# Patient Record
Sex: Female | Born: 1960 | Race: Black or African American | Hispanic: No | Marital: Married | Smoking: Former smoker
Health system: Southern US, Community
[De-identification: ages and names within clinical notes are randomized; demographics above are authoritative.]

## PROBLEM LIST (undated history)

## (undated) DIAGNOSIS — E119 Type 2 diabetes mellitus without complications: Secondary | ICD-10-CM

## (undated) DIAGNOSIS — E785 Hyperlipidemia, unspecified: Secondary | ICD-10-CM

## (undated) DIAGNOSIS — I10 Essential (primary) hypertension: Secondary | ICD-10-CM

## (undated) DIAGNOSIS — J40 Bronchitis, not specified as acute or chronic: Secondary | ICD-10-CM

## (undated) DIAGNOSIS — I639 Cerebral infarction, unspecified: Secondary | ICD-10-CM

## (undated) HISTORY — PX: ABDOMINAL SURGERY: SHX537

## (undated) HISTORY — DX: Type 2 diabetes mellitus without complications: E11.9

## (undated) HISTORY — DX: Essential (primary) hypertension: I10

## (undated) HISTORY — DX: Hyperlipidemia, unspecified: E78.5

---

## 2006-07-02 ENCOUNTER — Ambulatory Visit: Payer: Self-pay | Admitting: Cardiovascular Disease

## 2006-07-02 ENCOUNTER — Inpatient Hospital Stay (HOSPITAL_COMMUNITY): Admission: EM | Admit: 2006-07-02 | Discharge: 2006-07-09 | Payer: Self-pay | Admitting: Emergency Medicine

## 2006-07-03 ENCOUNTER — Encounter: Payer: Self-pay | Admitting: Cardiovascular Disease

## 2006-07-08 ENCOUNTER — Ambulatory Visit: Payer: Self-pay | Admitting: Physical Medicine & Rehabilitation

## 2006-07-09 ENCOUNTER — Inpatient Hospital Stay (HOSPITAL_COMMUNITY)
Admission: RE | Admit: 2006-07-09 | Discharge: 2006-08-02 | Payer: Self-pay | Admitting: Physical Medicine & Rehabilitation

## 2006-07-09 ENCOUNTER — Ambulatory Visit: Payer: Self-pay | Admitting: Physical Medicine & Rehabilitation

## 2006-08-27 ENCOUNTER — Encounter
Admission: RE | Admit: 2006-08-27 | Discharge: 2006-08-29 | Payer: Self-pay | Admitting: Physical Medicine & Rehabilitation

## 2006-08-29 ENCOUNTER — Ambulatory Visit: Payer: Self-pay | Admitting: Physical Medicine & Rehabilitation

## 2006-09-05 ENCOUNTER — Encounter
Admission: RE | Admit: 2006-09-05 | Discharge: 2006-11-01 | Payer: Self-pay | Admitting: Physical Medicine & Rehabilitation

## 2006-11-19 ENCOUNTER — Encounter (HOSPITAL_COMMUNITY): Admission: RE | Admit: 2006-11-19 | Discharge: 2006-11-19 | Payer: Self-pay | Admitting: Internal Medicine

## 2006-11-25 ENCOUNTER — Encounter (HOSPITAL_COMMUNITY): Admission: RE | Admit: 2006-11-25 | Discharge: 2006-12-25 | Payer: Self-pay | Admitting: Internal Medicine

## 2006-12-27 ENCOUNTER — Encounter (HOSPITAL_COMMUNITY): Admission: RE | Admit: 2006-12-27 | Discharge: 2007-01-26 | Payer: Self-pay | Admitting: Internal Medicine

## 2007-01-27 ENCOUNTER — Encounter (HOSPITAL_COMMUNITY): Admission: RE | Admit: 2007-01-27 | Discharge: 2007-02-19 | Payer: Self-pay | Admitting: Internal Medicine

## 2007-02-21 ENCOUNTER — Encounter (HOSPITAL_COMMUNITY): Admission: RE | Admit: 2007-02-21 | Discharge: 2007-03-23 | Payer: Self-pay | Admitting: Internal Medicine

## 2007-07-09 ENCOUNTER — Encounter: Admission: RE | Admit: 2007-07-09 | Discharge: 2007-08-07 | Payer: Self-pay | Admitting: Internal Medicine

## 2008-06-15 ENCOUNTER — Encounter: Admission: RE | Admit: 2008-06-15 | Discharge: 2008-06-15 | Payer: Self-pay | Admitting: Obstetrics and Gynecology

## 2009-04-02 ENCOUNTER — Encounter: Admission: RE | Admit: 2009-04-02 | Discharge: 2009-04-02 | Payer: Self-pay | Admitting: Obstetrics and Gynecology

## 2009-05-09 ENCOUNTER — Ambulatory Visit (HOSPITAL_COMMUNITY): Admission: RE | Admit: 2009-05-09 | Discharge: 2009-05-09 | Payer: Self-pay | Admitting: Interventional Radiology

## 2009-05-10 ENCOUNTER — Ambulatory Visit (HOSPITAL_COMMUNITY): Admission: RE | Admit: 2009-05-10 | Discharge: 2009-05-11 | Payer: Self-pay | Admitting: Interventional Radiology

## 2009-05-25 ENCOUNTER — Encounter: Admission: RE | Admit: 2009-05-25 | Discharge: 2009-05-25 | Payer: Self-pay | Admitting: Interventional Radiology

## 2009-11-23 ENCOUNTER — Inpatient Hospital Stay (HOSPITAL_COMMUNITY): Admission: EM | Admit: 2009-11-23 | Discharge: 2009-12-02 | Payer: Self-pay | Admitting: Emergency Medicine

## 2009-11-28 ENCOUNTER — Ambulatory Visit: Payer: Self-pay | Admitting: Physical Medicine & Rehabilitation

## 2010-01-10 ENCOUNTER — Encounter: Admission: RE | Admit: 2010-01-10 | Discharge: 2010-01-10 | Payer: Self-pay | Admitting: Interventional Radiology

## 2010-01-10 ENCOUNTER — Encounter: Admission: RE | Admit: 2010-01-10 | Discharge: 2010-01-10 | Payer: Self-pay | Admitting: Obstetrics and Gynecology

## 2010-01-17 ENCOUNTER — Encounter: Admission: RE | Admit: 2010-01-17 | Discharge: 2010-01-17 | Payer: Self-pay | Admitting: Neurosurgery

## 2010-03-11 ENCOUNTER — Encounter: Payer: Self-pay | Admitting: Interventional Radiology

## 2010-03-11 ENCOUNTER — Encounter: Payer: Self-pay | Admitting: Obstetrics and Gynecology

## 2010-03-12 ENCOUNTER — Encounter: Payer: Self-pay | Admitting: Physical Medicine & Rehabilitation

## 2010-03-13 ENCOUNTER — Encounter: Payer: Self-pay | Admitting: Obstetrics and Gynecology

## 2010-03-16 ENCOUNTER — Other Ambulatory Visit: Payer: Self-pay | Admitting: Internal Medicine

## 2010-03-16 DIAGNOSIS — Z1239 Encounter for other screening for malignant neoplasm of breast: Secondary | ICD-10-CM

## 2010-05-04 LAB — PREPARE FRESH FROZEN PLASMA

## 2010-05-04 LAB — CBC
HCT: 37.9 % (ref 36.0–46.0)
HCT: 39.6 % (ref 36.0–46.0)
MCH: 28 pg (ref 26.0–34.0)
MCH: 28.1 pg (ref 26.0–34.0)
MCV: 88.3 fL (ref 78.0–100.0)
MCV: 88.4 fL (ref 78.0–100.0)
Platelets: 392 10*3/uL (ref 150–400)
RDW: 15.5 % (ref 11.5–15.5)
RDW: 15.9 % — ABNORMAL HIGH (ref 11.5–15.5)
WBC: 11.4 10*3/uL — ABNORMAL HIGH (ref 4.0–10.5)
WBC: 15.9 10*3/uL — ABNORMAL HIGH (ref 4.0–10.5)

## 2010-05-04 LAB — DIFFERENTIAL
Basophils Absolute: 0 10*3/uL (ref 0.0–0.1)
Eosinophils Absolute: 0.1 10*3/uL (ref 0.0–0.7)
Eosinophils Relative: 1 % (ref 0–5)
Lymphocytes Relative: 19 % (ref 12–46)
Lymphs Abs: 2.1 10*3/uL (ref 0.7–4.0)
Monocytes Absolute: 0.5 10*3/uL (ref 0.1–1.0)

## 2010-05-04 LAB — BASIC METABOLIC PANEL
BUN: 12 mg/dL (ref 6–23)
CO2: 21 mEq/L (ref 19–32)
Chloride: 109 mEq/L (ref 96–112)
Glucose, Bld: 129 mg/dL — ABNORMAL HIGH (ref 70–99)
Potassium: 4 mEq/L (ref 3.5–5.1)

## 2010-05-04 LAB — POCT I-STAT, CHEM 8
Calcium, Ion: 1.16 mmol/L (ref 1.12–1.32)
Chloride: 106 mEq/L (ref 96–112)
Creatinine, Ser: 0.6 mg/dL (ref 0.4–1.2)
Glucose, Bld: 105 mg/dL — ABNORMAL HIGH (ref 70–99)
HCT: 41 % (ref 36.0–46.0)
Hemoglobin: 13.9 g/dL (ref 12.0–15.0)
Potassium: 3.4 mEq/L — ABNORMAL LOW (ref 3.5–5.1)

## 2010-05-04 LAB — PROTIME-INR
INR: 1.31 (ref 0.00–1.49)
Prothrombin Time: 16.5 seconds — ABNORMAL HIGH (ref 11.6–15.2)
Prothrombin Time: 32.4 seconds — ABNORMAL HIGH (ref 11.6–15.2)

## 2010-05-04 LAB — MRSA PCR SCREENING: MRSA by PCR: NEGATIVE

## 2010-05-05 ENCOUNTER — Other Ambulatory Visit: Payer: Self-pay | Admitting: Internal Medicine

## 2010-05-05 DIAGNOSIS — Z1231 Encounter for screening mammogram for malignant neoplasm of breast: Secondary | ICD-10-CM

## 2010-05-15 LAB — CBC
Hemoglobin: 12 g/dL (ref 12.0–15.0)
MCHC: 31.8 g/dL (ref 30.0–36.0)
RDW: 15.5 % (ref 11.5–15.5)

## 2010-05-15 LAB — CREATININE, SERUM: Creatinine, Ser: 0.66 mg/dL (ref 0.4–1.2)

## 2010-05-15 LAB — PROTIME-INR: INR: 0.98 (ref 0.00–1.49)

## 2010-05-30 ENCOUNTER — Ambulatory Visit: Payer: Self-pay

## 2010-05-31 ENCOUNTER — Ambulatory Visit
Admission: RE | Admit: 2010-05-31 | Discharge: 2010-05-31 | Disposition: A | Discharge status: Home / Self Care | Payer: Medicare Other | Source: Ambulatory Visit | Attending: Internal Medicine | Admitting: Internal Medicine

## 2010-05-31 DIAGNOSIS — Z1231 Encounter for screening mammogram for malignant neoplasm of breast: Secondary | ICD-10-CM

## 2010-07-04 NOTE — H&P (Signed)
Andrea Townsend, Andrea Townsend            ACCOUNT NO.:  [id number]   MEDICAL RECORD NO.:  [id number]          PATIENT TYPE:  INP   LOCATION:  1824                         FACILITY:  [facility]   PHYSICIAN:  Michael L. Reynolds, M.D.DATE OF BIRTH:  Jun 24, 1960   DATE OF ADMISSION:  07/02/2006  DATE OF DISCHARGE:                              HISTORY & PHYSICAL   CHIEF COMPLAINT:  Outside code stroke with aphasia and right-sided  weakness.   HISTORY OF PRESENT ILLNESS:  This is the initial [facility]  system admission for this 50 year old woman with no known chronic  medical problems.  The patient awoke about 6:30-7:00 O'clock, according  to her husband, and seemed to be doing well at that time.  She was  speaking normally and moving around normally.  She then went into the  kitchen to prepare breakfast, and sometime between 7-7:30 a.m., the  patient's husband heard her fall.  He immediately got up and came to her  side at which time he found her on the floor weak on the right side and  unable to communicate.  He called EMS and she was taken promptly to  [facility].  She was assessed at that time and found to have  stable vital signs.  The routine code stroke protocol was undertaken  including placement of IV and Foley catheter and routine labs, all of  which are unremarkable except for the unexpected finding of significant  microcytic anemia.  She had a CT of the head which was read out as  showing edema.  I spoke with the physician in the emergency department  at [facility], and advised she be transferred to the [facility]  system emergently for further stroke management.  However, she was not  given TPA en-route because of the report of edema on the CT scan.  Upon arrival to [facility] hospital at approximately 9:15, she was  evaluated by the stroke team and the rapid response team.  At that  point, she had shown some improvement with restoration of anti-gravity  strength  in the right lower extremity and return of a little bit of  expressive speech with good receptive understanding of speech.  NIH  stroke scale was a 10 on admission.  Her CT was reviewed and was felt  not to demonstrate any edema.  She is receiving intravenous TPA per  protocol at this time.  The patient has had no history of any similar  events or other similar symptoms.   PAST MEDICAL HISTORY:  She has had no known chronic medical problems.  She has had recent symptoms of upper respiratory allergies and  congestion.  She has no known stroke risk factors.   FAMILY HISTORY:  Her husband said she has diabetes in her family,  otherwise unknown.   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  She lives at home with her husband.  She does smoke a pack of cigarettes  a day.  There is no history of illicit drug use.   ALLERGIES:  NO KNOWN DRUG ALLERGIES.   MEDICATIONS:  None chronically,  although she has taken Allegra-D and  Claritin recently per her husband.   REVIEW OF SYSTEMS:  Remarkable for menorrhagia on specific questioning.  She has also recent allergy symptoms as noted above.  Otherwise, a full  10-system review of systems is negative, although fairly limited by the  patient's aphasia.   PHYSICAL EXAMINATION:  VITAL SIGNS:  Temperature 97.4, blood pressure  137/78, pulse 83, respirations 20, O2 sat 99% on room air.  Weight  approximately 170 pounds.  GENERAL EXAMINATION:  This is a mildly obese, but otherwise healthy  appearing woman supine in the hospital bed.  No evidence of stress.  HEAD:  Cranium is normocephalic without evidence of obvious trauma.  NECK:  Is in a cervical collar.  CHEST:  Clear to auscultation bilaterally.  HEART:  Regular rate and rhythm without murmur.  ABDOMEN:  Soft.  Normoactive bowel sounds.  EXTREMITIES:  No edema.  2+ pulses.  NEUROLOGIC EXAMINATION/MENTAL STATUS:  She is awake and alert.  She is  able to follow  one-step and some two-step commands, but has significant  expressive aphasia to the point that she is not well able to communicate  beyond a few simple words and phrases.  CRANIAL NERVES:  Pupils are equal and reactive.  Extraocular movements  full without nystagmus.  Visual fields full to confrontation.  Hearing  is intact to conversational speech.  Facial sensation is symmetric.  She  has a definite right facial droop.  MOTOR:  Normal bulk and tone.  She has less then anti-gravity strength  in the right upper extremity and greater than anti-gravity strength with  only slight weakness in the right lower extremity.  Left side is normal.  SENSORY:  She has poor pin-prick sensation on the right compared to the  left.  COORDINATION:  Finger-to-nose, heel-to-shin performed accurately on the  left.  Gait is deferred.   LABORATORY REVIEW:  CBC:  White count 9, hemoglobin 8.7, platelets  473,000.  C-Met is remarkable only for an elevated glucose of 136, BUN  and creatinine of 4 and 27 respectively.  Chemistry showed a slightly  low PTT of 20.5, normal PT of 11.5.  Urinalysis is negative.  CT of the  head is as above.   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   PLAN:  She is receiving intravenous TPA per protocol.  At the time of  completion of her TPA, we will consider for further intervention with a  possible clot retrieval or mechanical thrombolysis.  She will then be  admitted to the ICU and stroke service will follow.      Michael L. Thad Ranger, M.D.  Electronically Signed     MLR/MEDQ  D:  07/02/2006  T:  07/02/2006  Job:  [id number]

## 2010-07-04 NOTE — Discharge Summary (Signed)
[name], [name] NO.:  [id number]   MEDICAL RECORD NO.:  [id number]          PATIENT TYPE:  IPS   LOCATION:  4007                         FACILITY:  [facility]   PHYSICIAN:  Ellwood Dense, M.D.   DATE OF BIRTH:  1960-06-28   DATE OF ADMISSION:  07/09/2006  DATE OF DISCHARGE:  08/02/2006                               DISCHARGE SUMMARY   DISCHARGE DIAGNOSES:  1. 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  2. Hypertension.  3. Dysphagia, resolved.  4. Situation depression.  5. Sinusitis treated.  6. Hyperglycemia secondary to tube feeds, resolved.   HISTORY OF PRESENT ILLNESS:  [name] is a 50 year old  female in relatively good health, admitted to [facility] Jul 02, 2006 with right-sided weakness and aphagia. __________ without out  acute abnormality.  The patient treated with TPA, cerebral angio with  stenting with left MCA done. A 2D echo done showed an EF of 55%,  question of calcified lesion with a clot in LA.  Followup PE showed EF  of 55% to 65%, with atrioseptal aneurysm.  No thrombus.  No shunt.  The  patient did have problems with respiratory distress.  Question  aspiration versus edema.  Followup chest x-ray showed air space density  to be focal edema.  The patient on Panda tube with nutritional support.  Followup MRI on Jul 05, 2006 shows left inferior, left MCA infarct with  a left ACA infarct, of midline shift of 8 mm and __________  to branch  reoccluded, extensive sinus disease, occluded left ICA with  reconstitution at the level of ophthalmic.  The patient currently  continues to be aphasic and aphonic with global aphagia.  She is  __________ Therapy was initiated with patient able to sit at edge of bed  10 minutes with improvement in posture.  She is minimal assist to  supervision to maintain her posture.  She is currently able to stand  with mod assist, but __________ .  Rehab consult for  progressive  therapy.   PAST MEDICAL HISTORY:  Significant for allergies and C-section in 90s.   ALLERGIES:  No known drug allergies.   FAMILY HISTORY:  Positive for diabetes.   SOCIAL HISTORY:  The patient is married.  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  Smokes 1 pack.  Does not use any alcohol.  Lived in 2-level home with 4-5 steps at entry.  Daughter currently plans  for patient to be discharged to daughter's home in [place].   HOSPITAL COURSE:  [name] was admitted to rehab on Jul 09, 2006 for inpatient therapies to consist of OT and PT daily.  After  admission, patient was maintained on aspirin and Plavix for DVT  prophylaxis.  She was noted to have a dense hemiparesis with change in  right facial droop.  Expressive, she was noted to have global aphagia  with attempts to make some guttural sounds.  She was noted to nod yes to  all questions.  The patient was noted to have leukocytosis on admission  and was started on Amoxicillin for  treatment of sinusitis, as well as a  low-dose Lasix secondary to question of pulmonary edema on chest x-ray  as well as for blood pressure management.  She was maintained on tube  feeds initially, secondary to her dysphagia.  A sliding scale insulin  was used for elevated blood sugars.  Labs on past admission, on Jul 10, 2006 revealed hemoglobin 10.4, hematocrit 32.1, white count 18.0,  platelets 669.  Check of lytes revealed sodium 136, potassium 4.6,  chloride 101, CO2 28, BUN 14, creatinine 0.8, glucose 117.  LFTs reveal  AST 48, ALT 76, alkaline phos 134, albumin 3.0.  Speech Therapy followed  patient along with bedside swallow.  Initially started on __________  thick liquids.  She was maintained on Augmentin b.i.d. through Jul 20, 2006 and then she was also noted to have E. coli UTI at admission.  A  Foley was initially kept in place until mobility improved.  A modified  Barium swallow was done on Jul 19, 2006.  The patient was advanced to  __________  diet thin liquids with full supervision.  The patient was  started on __________ and was able to adhere to aspiration precautions  with increased independence by the time of discharge.  She was noted to  have increase in lability with signs of depression.  Was started on  Lexapro for mood stabilization.  The Foley was discontinued on July 23, 2006 and toileting schedule was initiated.  The patient was noted to be  voiding without any signs of retention.  She was noted to be continent  of bowel and bladder at time of discharge.  A routine check of labs have  been done.  Last CBC of July 22, 2006 shows leukocytosis has resolved,  with white count of 8.6, hemoglobin 9.7, hematocrit 31.3, platelets 939.  Check of electrolytes of July 22, 2006 revealed sodium 140, potassium  4.3, a chloride of 105, CO2 21, BUN 10, creatinine 0.83, glucose 96.  LFTs remain elevated with AST 44, ALT 82, alkaline phos 128, T. bili of  0.3.  The patient's blood pressures have been monitored on a b.i.d.  basis during this stay and have been stable overall.  At time of  discharge, blood pressures range 100-110 systolic, 60s-to 16X diastolic,  heart rate stable in 60s to 80s range.  Hemoglobin A1c, at admission,  was noted to be normal at 5.6.  Once tube feeds were discontinued, CBG  checks were decreased to once a day with blood sugars ranging at 100 to  occasional high 107 range.  Her p.o. intake has been good.   At the time of discharge, the patient was noted to have improved in  mood, was noted to be smiling with attempts to participate in  conversation.  Verbal output was emerging.  She was able to follow 1-  step commands  with 50% accuracy, able to identify 1 item in a field of  5, with cues given.  She was unable to understand complex level  questions.  She was able to repeat single phonemes with total cuing. Reading comprehension, writing comprehension and  high-level attention  namely in executive function tests.  Testing remained impaired.  She is  able to selectively attend to task in a moderate distracted environment.  She did not attempt to get out of chair or bed without assist.  She was  able to carry over right handed dressing techniques, upper body  dressings with only minimal tactile cues  for ADLs.  Problem solving was  impaired for unfamiliar tasks.  The patient's static sitting balance  improved to modified independence level.  She was min to mod assist for  static standing balance, patient needs support with decreased weight  shifting and right lower extremity required some mod assist for simply  weight shifting and parallel bars.  She was noted to have __________ in  right lower extremity, with some decrease extensor tone right lower  extremity and question whether this was to help facilitate right lower  extremity in standing.  The patient was able to ambulate 70/30 holding  right platform walker with Ace wrap to right lower extremity required  mod assist secondary to poor weight shifting and decreased weightbearing  on right lower extremity, decrease in activation of hip and knee.  She  required total assist to advance right lower extremity.  The patient is  at minimal assist with bathing and of dressing, supervision for  grooming, min assist for toileting and 24-hour assistance is needed.  Post discharge, her family is to continue to provide this.  She will  also continue with further progressive PT/OT and speech therapy  __________ prior to discharge.  On August 02, 2006, the patient is  discharged to home.   DISCHARGE MEDICATIONS:  1. Coated aspirin 81 mg a day.  2. Plavix 75 mg a day.  3. Claritin 10 mg a day.  4. Trazodone 50 mg q.h.s.  5. Lasix 20 mg a day.  6. K-Dur 20 mEq a day.  7. Lexapro 10 mg a day.  8. Senokot-S 2 p.o. q.h.s.  9. Nasonex nasal spray 2 squirts in nostrils daily.   DIET:  Lowfat.    ACTIVITY:  Level is 24 hour supervision assistance.  Especially  instructed no alcohol, no smoking or driving.   FOLLOWUP:  The patient to follow up with Dr. Thomasena Edis on August 29, 2006 at  10 a.m.  Follow up with Dr. Pearlean Brownie in 4-6 weeks.  Follow up with Dr.  Pecola Leisure on August 14, 2006 at 10:30 a.m.      Greg Cutter, P.A.    ______________________________  Ellwood Dense, M.D.    PP/MEDQ  D:  08/19/2006  T:  08/20/2006  Job:  [id number]   cc:   Jocelyn Lamer D. Pecola Leisure, M.D.  Pramod P. Pearlean Brownie, MD

## 2010-07-04 NOTE — Discharge Summary (Signed)
NAMEMERCEDEES, CONVERY            ACCOUNT NO.:  [id number]   MEDICAL RECORD NO.:  [id number]          PATIENT TYPE:  INP   LOCATION:  3019                         FACILITY:  [facility]   PHYSICIAN:  Pramod P. Pearlean Brownie, MD    DATE OF BIRTH:  04/24/1960   DATE OF ADMISSION:  07/02/2006  DATE OF DISCHARGE:  07/09/2006                               DISCHARGE SUMMARY   DISCHARGE DIAGNOSES:  1. Large left hemispheric infarct status post embolization left      terminal ICA status post t-PA and left internal carotid artery      stent with attempted [facility] unsuccessful penumbra device.  Embolic      etiology unclear.  2. Obesity.  3. Cigarette smoker.  4. Upper respiratory allergies.   DISCHARGE MEDICATIONS:  1. Lovenox 40 mg subcu daily.  2. Aspirin 81 mg p.o. daily.  3. Plavix 75 mg p.o. daily.  4. Protonix 40 mg p.o. daily.  5. Potassium 20 mEq daily.  6. Jevity tube feeds at a goal of 60 mL an hour with 100 mL water      flushes every 4 hours.   STUDIES PERFORMED:  1. CT of the brain on admission shows no acute intracranial      abnormality.  2. Cervical spine CT shows no acute fracture or subluxation of the      cervical spine.  3. Chest x-ray showed patchy bilateral air space processes consistent      with infection, aspiration, or edema.  Bibasilar atelectasis.  4. CT of the head at 24 hours shows large area of subacute infarct      involving the left middle cerebral artery territory.  There also      may be some subacute infarct in the terminal left anterior cerebral      artery territory.  Also some small area of acute hemorrhage in the      left frontal parietal area.  5. MRI of the brain shows large left MCA and posterior left ACA      infarct.  Mass effect with midline shift of approximately 8 mm and      parietal effacement of the left lateral ventricle.  No definite      hemorrhage.  Occluded flow in the distal left internal carotid      artery.  Cerebellar tonsils of the  level of the foramen of Monro.      Extensive sinus disease likely related to intubation.  6. MRA of the brain shows occluded left internal carotid artery with      reconstitution at the level of the ophthalmic.  Middle M2 branch      previously open has reoccluded.  7. Follow-up chest x-ray shows slight decrease in the right lower lobe      atelectasis or infiltrate, mild bronchitic changes and      cardiomegaly.  8. Multiple abdominal x-rays verifying Panda tube placement.  9. A 2-D echocardiogram shows EF of 55% with no left ventricular      regional wall motion abnormalities.  Aortic valve mildly calcified.      Question artifact in  subcostal views with small calcified lesion in      the left atrium.  10.Transesophageal echocardiogram shows EF of 55-60% with no left      ventricular regional wall motion abnormalities.  There was mild      atheroma in the descending aorta.  There was no interatrial flow by      color Doppler.  No left atrial appendage thrombus.  No intracardiac      shunt.  Question small atrial septal aneurysm.  11.EKG shows a normal sinus rhythm with short PR nonspecific ST      abnormality.   LABORATORY STUDIES:  CBC with hemoglobin 10.9, hematocrit 34.8, white  blood cells 16.3, platelets 476,000 and MCV 76.1.  Chemistry normal.  Coagulation studies normal.  Liver function tests normal.  Albumin 2.6.  Cardiac enzymes not performed.  Cholesterol 132, triglycerides 83, HDL  33 and LDL 82.  Urinalysis on the 13th showed rare bacteria, no white  blood cells, no red blood cells.  Homocystine 2.9, hemoglobin A1c 5.6.  Urine drug screen positive for amphetamines.   HISTORY OF PRESENT ILLNESS:  Andrea Townsend is a 50 year old  right-handed African American female with no known chronic medical  problems.  She awoke around 6:30-7 a.m. according to her husband and  seemed to be doing well at that time.  She was speaking normally and  moving normally.  She went to  the kitchen to prepare breakfast and some  time between 7 and 7:30 a.m. the patient's husband heard her fall.  He  immediately got up and came to her side at which time he found her on  the floor, weak on the right side and unable to communicate.  He called  EMS and she was taken promptly [facility].  She was assessed at  that time found to have stable vital signs.  Code stroke was called and  procedures undertaken, including placement of IV and Foley catheter and  routine labs which were unremarkable except for an eclectic finding of  significant microcytic anemia.  She had a CT of the head which was read  out as showing edema.  Dr. Thad Ranger spoke with the physician in the  emergency room at [facility] and advised her to be transferred to [facility] emergency room for further stroke management.  She was not given t-  PA and [place] secondary to edema on CT scan.  Upon arrival to [facility] at 9:15 she was evaluated by the stroke team and the rapid  response team.  At that point she has shown improvement with restoration  of antigravity strength in the right lower extremity and return of a  little bit of expressive speech with good receptive understanding of  speech. NIH stroke scale was 10 on admission.  Her CT was reviewed and  felt not to demonstrate any edema.  She received full-dose IV t-PA at  that time.  At the completion of the IV tPA she was sent to the angio  suite for evaluation where she was found to have a clot in her left ICA  as well as distal embolization in the MCA branch.  A left ICA stent was  placed, [facility] retrieval device was attempted unsuccessfully.  The number  of device was successful in removing the M1 clot. She was admitted to  the ICU for further evaluation.   HOSPITAL COURSE:  The patient did have an embolic left ACA and MCA  infarct felt to be  secondary to a left ICA clot and clot migration.  At the time of her MRI she was initially unable to  swallow and a PEG tube  was placed for feeding.  Her MRI was performed 2 days later and  showed  an occluded left ICA at the area of the stent and also reocclusion of  the M1 segment.  Her blood pressure remained stable and she was  transferred out of ICU.  She was evaluated for PT and OT and felt to  benefit from inpatient rehab.  While her stroke was felt to be embolic,  a clear-cut source was not found.  Transesophageal echocardiogram  revealed no source.  She only has vascular risk factors of mild obesity  and cigarette smoking.  During hospitalization the patient improved in  level of consciousness though she still had significant right  hemiparesis and global aphasia.  Plans were to send her to rehab.   Of note, the patient has had significant social distress over the past  month.  Her mother died at 45 unexpectedly within the past month.  Her  husband has some drug abuse issues and has been missing since Jul 04, 2006 after borrowing his daughter's car and has not been seen or heard  from since.  The patient's daughter is a Veterinary surgeon at a local school and  will be out of work for the summer in order to help provide care for her  mother.  She has a 35 year old brother who will be graduating from high  school this summer.  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  She does live at home with her husband.  She has no  history of illicit drug use.   CONDITION ON DISCHARGE:  The patient alert but remains globally aphasic  and mute.  She will follow a few simple midline commands.  She has right  facial weakness.  Her extraocular movements are intact.  She has right  homonymous hemianopsia.  She has dense right hemiparesis but moves her  left side spontaneously.  Her chest is clear to auscultation.  Heart  rate is regular.   DISCHARGE/PLAN:  1. Transfer to rehab for continuation of PT, OT and speech therapy.  2. Panda for tube feeding.  May need to consider a  PEG prior to      discharge from rehab, though her swallowing needs to be reevaluated      by Speech there.  3. Aspirin and Plavix for secondary stroke prevention.  Plavix      necessary secondary to stent.  4. The patient will need outpatient TCD emboli monitoring and embolus      study.  5. Hypercoagulable panel pending at time of discharge.  6. Urinalysis and urine culture pending at time of discharge.  7. Follow up with primary care physician (obtain a primary care      physician if she does not have one) within 1 month of discharge.  8. Follow-up with Dr. Janalyn Shy P. Sethi in 2-3 months.      Annie Main, N.P.    ______________________________  Sunny Schlein. Pearlean Brownie, MD    SB/MEDQ  D:  07/09/2006  T:  07/09/2006  Job:  [id number]   cc:   Pramod P. Pearlean Brownie, MD

## 2010-07-04 NOTE — Assessment & Plan Note (Signed)
Ms. Lanza returns to the clinic today for followup evaluation. The  patient is a 50 year old female who was in relatively good health when  she was admitted to [facility] Jul 02, 2006 with acute onset of  right-sided weakness and aphasia. Initial treatment showed that she was  a candidate for TPA and she underwent TPA with cerebral angiogram along  with stenting of the left middle cerebral artery. A 2D echocardiogram  showed an ejection fraction of 55% with questionable calcified lesion or  clot in the left atrium. Followup cardiac study showed an ejection  fraction of 55-65% with atrial septal aneurysm with no thrombus and no  shunt. Followup chest x-ray showed airspace density to be focal edema.  She was initial on Panda tube treatments. 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  She was maintained on aspirin and Plavix at that time.   The patient eventually stabilized and was moved to the Rehabilitation  Unit on Jul 09, 2006 and remained there through discharge August 02, 2006.   At this point, the patient has received home health physical,  occupational and speech therapy. Therapist are noting that overall she  is making good steady progress. She is requiring minimal assist for  bending and dressing and incontinent of bowel or bladder. She ambulates  25 feet x2 with a small based quad cane with standby assist to contact  guard assist. Her standing balance is 3-4/5. Transfers are at standby  assist level. She has minimal speech production, although she has good  comprehension and nods her head yes and no appropriately. All therapist  apparently are feeling that the patient could be progressed to  outpatient therapies at this point.   I did complete other paperwork for the patient today regarding her  stroke.   MEDICATIONS:  1. Plavix 75  mg.  2. Aspirin 81 mg.  3. Claritin 10 mg.  4. Trazodone 50 mg q nightly.  5. Lasix 20 mg.  6. K-Dur 20 mEq daily.  7. Lexapro 10 mg q daily.  8. Senokot S two tablets p.o. q nightly.   REVIEW OF SYSTEMS:  Noncontributory.   PHYSICAL EXAMINATION:  Well-appearing, middle-aged, adult female in mild  to no acute discomfort. Blood pressure 118/54 with a pulse of 86,  respiratory rate is 17 and O2 saturation is 99% on room air. She is  seated in a manual wheelchair. Left upper and left lower extremity  strength were 5/5. Right upper and right lower extremity strength were  generally 2-3-/5. Sensation was slightly decreased to light touch  throughout the right arm and leg.   IMPRESSION:  1. 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  2. Hypertension.  3. Dysphagia, resolving.  4. Hyperglycemia secondary to tube feedings, resolved.   In the office today, no refill on medication is necessary. We did set  the patient up for outpatient with occupational, physical and speech  therapy at the [facility] address. Will plan on seeing her in  followup in approximately two  months time. Unfortunately, she has fairly significant paresis  especially on the right side which she is managing reasonably well with  good function despite what is apparently persistent weakness.  ______________________________  Ellwood Dense, M.D.     DC/MedQ  D:  08/29/2006 11:13:04  T:  08/29/2006 20:31:58  Job #:  [id number]

## 2010-07-04 NOTE — H&P (Signed)
NAMEHERO, MCCATHERN NO.:  [id number]   MEDICAL RECORD NO.:  [id number]          PATIENT TYPE:  IPS   LOCATION:  4007                         FACILITY:  [facility]   PHYSICIAN:  Ranelle Oyster, M.D.DATE OF BIRTH:  [birth year]   DATE OF ADMISSION:  07/09/2006  DATE OF DISCHARGE:  07/09/2006                              HISTORY & PHYSICAL   PRIMARY CARE PHYSICIAN:  Dr. Donnie Aho   NEUROLOGIST:  Pramod P. Pearlean Brownie, MD   CHIEF COMPLAINT:  Right-sided weakness and aphagia.   HISTORY OF PRESENT ILLNESS:  This is a 50 year old African American  female in good health admitted on May 13, with right-sided weakness and  aphagia.  CAT scan was without initial abnormality.  The patient was  treated with TPA.  A cerebral angio with stenting of left MCA was also  done the same day by Dr. Corliss Skains.  A 2D echo revealed an ejection  fraction of 55% with calcified lesions in the left atrium.  Followup TEE  revealed an ejection fraction of 55% to 65% with atrial septal aneurysm  and no thrombus and no shunt.  The patient had problems with respiratory  distress with some question of aspiration verse edema.  The patient was  placed on tube feeds for nutritional support.  The endo tube had to be  replaced this morning.   On May 16, an MRI revealed a large left MCA and left ACA infarct with  occluded flow in the left ICA and mass effect in midline shift of 8 mm.  The middle end tube branch was re-occluded.  She had extensive sinus  disease as well as occluded left ICA with reconstitution at the level of  the opthalmic artery.  The patient continues with significant left-sided  weakness, aphagia, etcetera.  She continued to need heavy assistance for  basic self-care mobility thus was brought to the inpatient rehab unit  today.   REVIEW OF SYSTEMS:  Notable for Foley catheter in place.  She has been  moving her bowels.  Other pertinent positives as listed above and full  reviews have been  written in the history and physical.   PAST MEDICAL HISTORY:  Positive for:  1. C-section in 1990.  2. Allergies.   FAMILY HISTORY:  Positive for diabetes.   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  She lives in [place].  She smokes one-  pack of cigarettes per day and does not drink.  Lives in a 2 level house  with five steps to enter.  Prior to arrival she was clearly independent.  Currently she is max assist for basic mobility and self-care.   MEDICATIONS:  At home include Allegra D or Claritin.   ALLERGIES:  NONE.   LABORATORY DATA:  Hemoglobin 10.9, white count 16.3, platelets 476,000.  Sodium 137, potassium 3.7, BUN and creatinine 10 and 0.55.   PHYSICAL EXAMINATION:  VITAL SIGNS:  Blood pressure is 140/91, pulse is  80, respiratory rate 20, temperature 97.6.  GENERAL:  The patient is alert, sitting in bed with eyes open.  She is  able to tract somewhat at a distance.  She is overweight.  HEENT:  Pupils equal, round, reactive to light.  Ear, nose, and throat  exam was notable for NG tube in nose.  She had white coating over the  tongue.  NECK:  Supple without JVD or lymphadenopathy.  CHEST:  Clear to auscultation bilaterally without wheezes, rales, or  rhonchi.  Although she had full respiratory effort on exam.  HEART:  Regular rate and rhythm without murmurs, rubs, or gallops.  ABDOMEN:  Soft, nontender.  Bowel sounds are positive.  SKIN:  Generally intact.  NEUROLOGICALLY:  Cranial nerves revealed a right central 7 and  difficulty with tongue movements.  The patient was aphemic essentially,  except for coughing.  Reflexes were 1+.  I was not able to illicit  Babinski reflex in the feet.  She withdrew to pain in the leg, but not  the right arm.  Judgment, orientation, memory and mood were unable to be  assessed.  The patient did have significant global aphagia and apraxia  to a certain extent.  Right  inattention is also noted.  The patient did  follow 2/5 one-step commands.  She had difficulty tracking my fingers in  front of her eyes to the left and specifically the right fields.   ASSESSMENT:  1. Functional deficits:  In the left middle cerebral artery and left      anterior cerebral artery infarct with a small hemorrhagic      conversion.  The patient currently is on Plavix and aspirin per      neurology recommendations.  Begin comprehensive inpatient rehab      with physical therapy to assess and treat for range of motion, bed      mobility transfers.  The patient may be a short distance gait      candidate, but is difficult to assess at this point.  Occupational      therapy will assess and treat the patient for range of motion,      strengthening, cognitive/perceptual training equipment, etcetera.      Speech and language pathology will followup for language skills,      dysphagia, cognition, etcetera.  Rehab nurse will follow on a 24      hour basis for bowel, bladder, skin, and wound care issues as well      as medications.  Rehab case manager/social worker was assessed for      psych and social needs and discharge planning.  Estimated length of      stay is 3 to 4 weeks.  Goals:  Min assist.  The prognosis is fair.      The patient seems to have good social support.  2. Hypertension:  Continue with Lasix.  Monitor clinically.  3. Leukocytosis with sinusitis:  Amoxicillin per Panda tube.  We will      monitor labs clinically.  4. Hyperglycemia:  Continue sliding scale insulin while on tube feeds.  5. Deep venous thrombosis prophylaxis:  Lovenox 40 mg subcu daily.  6. PRAFO for right lower extremity while in bed due to early heel cord      contracture.      Ranelle Oyster, M.D.  Electronically Signed     ZTS/MEDQ  D:  07/09/2006  T:  07/09/2006  Job:  [id number]

## 2010-12-07 LAB — PATHOLOGIST SMEAR REVIEW

## 2010-12-07 LAB — DIFFERENTIAL
Basophils Absolute: 0.1
Eosinophils Absolute: 0.3
Lymphocytes Relative: 31
Neutrophils Relative %: 59

## 2010-12-07 LAB — COMPREHENSIVE METABOLIC PANEL
Alkaline Phosphatase: 134 — ABNORMAL HIGH
BUN: 10
Chloride: 105
Glucose, Bld: 96
Potassium: 4
Total Bilirubin: 0.3

## 2010-12-07 LAB — CBC
HCT: 31.3 — ABNORMAL LOW
Hemoglobin: 9.7 — ABNORMAL LOW
WBC: 8.6

## 2011-06-20 ENCOUNTER — Other Ambulatory Visit: Payer: Self-pay | Admitting: Family Medicine

## 2011-06-20 ENCOUNTER — Other Ambulatory Visit: Payer: Self-pay | Admitting: *Deleted

## 2011-06-20 DIAGNOSIS — Z1231 Encounter for screening mammogram for malignant neoplasm of breast: Secondary | ICD-10-CM

## 2011-07-03 ENCOUNTER — Ambulatory Visit: Payer: Medicare Other

## 2011-08-17 ENCOUNTER — Ambulatory Visit
Admission: RE | Admit: 2011-08-17 | Discharge: 2011-08-17 | Disposition: A | Discharge status: Home / Self Care | Payer: Medicare Other | Source: Ambulatory Visit | Attending: Family Medicine | Admitting: Family Medicine

## 2011-08-17 DIAGNOSIS — Z1231 Encounter for screening mammogram for malignant neoplasm of breast: Secondary | ICD-10-CM | POA: Diagnosis not present

## 2011-09-26 DIAGNOSIS — I69920 Aphasia following unspecified cerebrovascular disease: Secondary | ICD-10-CM | POA: Diagnosis not present

## 2011-09-26 DIAGNOSIS — I69959 Hemiplegia and hemiparesis following unspecified cerebrovascular disease affecting unspecified side: Secondary | ICD-10-CM | POA: Diagnosis not present

## 2011-09-26 DIAGNOSIS — R4586 Emotional lability: Secondary | ICD-10-CM | POA: Diagnosis not present

## 2011-09-26 DIAGNOSIS — Z1211 Encounter for screening for malignant neoplasm of colon: Secondary | ICD-10-CM | POA: Diagnosis not present

## 2011-09-26 DIAGNOSIS — Z79899 Other long term (current) drug therapy: Secondary | ICD-10-CM | POA: Diagnosis not present

## 2011-11-20 DIAGNOSIS — Z1211 Encounter for screening for malignant neoplasm of colon: Secondary | ICD-10-CM | POA: Diagnosis not present

## 2011-11-20 DIAGNOSIS — Z8371 Family history of colonic polyps: Secondary | ICD-10-CM | POA: Diagnosis not present

## 2011-11-20 DIAGNOSIS — K648 Other hemorrhoids: Secondary | ICD-10-CM | POA: Diagnosis not present

## 2012-05-02 DIAGNOSIS — Z79899 Other long term (current) drug therapy: Secondary | ICD-10-CM | POA: Diagnosis not present

## 2012-05-02 DIAGNOSIS — I69920 Aphasia following unspecified cerebrovascular disease: Secondary | ICD-10-CM | POA: Diagnosis not present

## 2012-05-02 DIAGNOSIS — R232 Flushing: Secondary | ICD-10-CM | POA: Diagnosis not present

## 2012-05-02 DIAGNOSIS — I69959 Hemiplegia and hemiparesis following unspecified cerebrovascular disease affecting unspecified side: Secondary | ICD-10-CM | POA: Diagnosis not present

## 2012-06-05 DIAGNOSIS — R82998 Other abnormal findings in urine: Secondary | ICD-10-CM | POA: Diagnosis not present

## 2012-06-05 DIAGNOSIS — Z79899 Other long term (current) drug therapy: Secondary | ICD-10-CM | POA: Diagnosis not present

## 2012-06-12 ENCOUNTER — Other Ambulatory Visit (HOSPITAL_COMMUNITY)
Admission: RE | Admit: 2012-06-12 | Discharge: 2012-06-12 | Disposition: A | Discharge status: Home / Self Care | Payer: Medicare Other | Source: Ambulatory Visit | Attending: Family Medicine | Admitting: Family Medicine

## 2012-06-12 ENCOUNTER — Other Ambulatory Visit: Payer: Self-pay | Admitting: Family Medicine

## 2012-06-12 DIAGNOSIS — Z124 Encounter for screening for malignant neoplasm of cervix: Secondary | ICD-10-CM | POA: Diagnosis not present

## 2012-06-12 DIAGNOSIS — Z136 Encounter for screening for cardiovascular disorders: Secondary | ICD-10-CM | POA: Diagnosis not present

## 2012-06-12 DIAGNOSIS — Z0000 Encounter for general adult medical examination without abnormal findings: Secondary | ICD-10-CM | POA: Diagnosis not present

## 2012-06-25 ENCOUNTER — Ambulatory Visit: Payer: Medicare Other | Attending: Family Medicine | Admitting: Speech Pathology

## 2012-06-27 ENCOUNTER — Ambulatory Visit: Payer: Medicare Other | Admitting: Physical Therapy

## 2012-06-27 ENCOUNTER — Ambulatory Visit: Payer: Medicare Other | Admitting: Occupational Therapy

## 2013-03-03 DIAGNOSIS — R059 Cough, unspecified: Secondary | ICD-10-CM | POA: Diagnosis not present

## 2013-03-03 DIAGNOSIS — R05 Cough: Secondary | ICD-10-CM | POA: Diagnosis not present

## 2013-03-03 DIAGNOSIS — J019 Acute sinusitis, unspecified: Secondary | ICD-10-CM | POA: Diagnosis not present

## 2013-06-09 DIAGNOSIS — J209 Acute bronchitis, unspecified: Secondary | ICD-10-CM | POA: Diagnosis not present

## 2013-06-09 DIAGNOSIS — H6690 Otitis media, unspecified, unspecified ear: Secondary | ICD-10-CM | POA: Diagnosis not present

## 2013-06-10 ENCOUNTER — Other Ambulatory Visit: Payer: Self-pay | Admitting: Family Medicine

## 2013-06-10 DIAGNOSIS — Z1231 Encounter for screening mammogram for malignant neoplasm of breast: Secondary | ICD-10-CM

## 2013-06-24 ENCOUNTER — Ambulatory Visit
Admission: RE | Admit: 2013-06-24 | Discharge: 2013-06-24 | Disposition: A | Discharge status: Home / Self Care | Payer: Medicare Other | Source: Ambulatory Visit | Attending: Family Medicine | Admitting: Family Medicine

## 2013-06-24 DIAGNOSIS — Z1231 Encounter for screening mammogram for malignant neoplasm of breast: Secondary | ICD-10-CM | POA: Diagnosis not present

## 2013-07-02 DIAGNOSIS — J209 Acute bronchitis, unspecified: Secondary | ICD-10-CM | POA: Diagnosis not present

## 2013-07-02 DIAGNOSIS — R05 Cough: Secondary | ICD-10-CM | POA: Diagnosis not present

## 2013-07-02 DIAGNOSIS — R059 Cough, unspecified: Secondary | ICD-10-CM | POA: Diagnosis not present

## 2013-07-03 ENCOUNTER — Ambulatory Visit
Admission: RE | Admit: 2013-07-03 | Discharge: 2013-07-03 | Disposition: A | Discharge status: Home / Self Care | Payer: Medicare Other | Source: Ambulatory Visit | Attending: Family Medicine | Admitting: Family Medicine

## 2013-07-03 ENCOUNTER — Other Ambulatory Visit: Payer: Self-pay | Admitting: Family Medicine

## 2013-07-03 DIAGNOSIS — J40 Bronchitis, not specified as acute or chronic: Secondary | ICD-10-CM | POA: Diagnosis not present

## 2013-07-03 DIAGNOSIS — R053 Chronic cough: Secondary | ICD-10-CM

## 2013-07-03 DIAGNOSIS — R05 Cough: Secondary | ICD-10-CM

## 2013-08-12 DIAGNOSIS — L299 Pruritus, unspecified: Secondary | ICD-10-CM | POA: Diagnosis not present

## 2013-08-12 DIAGNOSIS — R059 Cough, unspecified: Secondary | ICD-10-CM | POA: Diagnosis not present

## 2013-08-12 DIAGNOSIS — R05 Cough: Secondary | ICD-10-CM | POA: Diagnosis not present

## 2013-08-13 ENCOUNTER — Other Ambulatory Visit: Payer: Self-pay | Admitting: Family Medicine

## 2013-08-13 DIAGNOSIS — J40 Bronchitis, not specified as acute or chronic: Secondary | ICD-10-CM

## 2013-08-13 DIAGNOSIS — R05 Cough: Secondary | ICD-10-CM

## 2013-08-13 DIAGNOSIS — R059 Cough, unspecified: Secondary | ICD-10-CM

## 2013-08-13 DIAGNOSIS — Z87891 Personal history of nicotine dependence: Secondary | ICD-10-CM

## 2013-08-17 ENCOUNTER — Emergency Department (HOSPITAL_COMMUNITY): Payer: Medicare Other

## 2013-08-17 ENCOUNTER — Emergency Department (HOSPITAL_COMMUNITY)
Admission: EM | Admit: 2013-08-17 | Discharge: 2013-08-17 | Disposition: A | Discharge status: Home / Self Care | Payer: Medicare Other | Attending: Emergency Medicine | Admitting: Emergency Medicine

## 2013-08-17 ENCOUNTER — Other Ambulatory Visit: Payer: Medicare Other

## 2013-08-17 ENCOUNTER — Encounter (HOSPITAL_COMMUNITY): Payer: Self-pay | Admitting: Emergency Medicine

## 2013-08-17 ENCOUNTER — Inpatient Hospital Stay: Admission: RE | Admit: 2013-08-17 | Payer: Medicare Other | Source: Ambulatory Visit

## 2013-08-17 DIAGNOSIS — R079 Chest pain, unspecified: Secondary | ICD-10-CM | POA: Diagnosis not present

## 2013-08-17 DIAGNOSIS — J209 Acute bronchitis, unspecified: Secondary | ICD-10-CM | POA: Diagnosis not present

## 2013-08-17 DIAGNOSIS — R05 Cough: Secondary | ICD-10-CM | POA: Diagnosis not present

## 2013-08-17 DIAGNOSIS — Z79899 Other long term (current) drug therapy: Secondary | ICD-10-CM | POA: Diagnosis not present

## 2013-08-17 DIAGNOSIS — Z87891 Personal history of nicotine dependence: Secondary | ICD-10-CM | POA: Insufficient documentation

## 2013-08-17 DIAGNOSIS — R059 Cough, unspecified: Secondary | ICD-10-CM | POA: Diagnosis not present

## 2013-08-17 DIAGNOSIS — J40 Bronchitis, not specified as acute or chronic: Secondary | ICD-10-CM | POA: Insufficient documentation

## 2013-08-17 DIAGNOSIS — Z8673 Personal history of transient ischemic attack (TIA), and cerebral infarction without residual deficits: Secondary | ICD-10-CM | POA: Diagnosis not present

## 2013-08-17 DIAGNOSIS — R0602 Shortness of breath: Secondary | ICD-10-CM | POA: Diagnosis not present

## 2013-08-17 HISTORY — DX: Cerebral infarction, unspecified: I63.9

## 2013-08-17 LAB — BASIC METABOLIC PANEL
BUN: 12 mg/dL (ref 6–23)
CHLORIDE: 104 meq/L (ref 96–112)
CO2: 21 mEq/L (ref 19–32)
CREATININE: 0.71 mg/dL (ref 0.50–1.10)
Calcium: 9.3 mg/dL (ref 8.4–10.5)
GFR calc Af Amer: >90 mL/min (ref >90)
GLUCOSE: 103 mg/dL — AB (ref 70–99)
POTASSIUM: 3.9 meq/L (ref 3.7–5.3)
Sodium: 140 mEq/L (ref 137–147)

## 2013-08-17 LAB — CBC WITH DIFFERENTIAL/PLATELET
Basophils Absolute: 0 10*3/uL (ref 0.0–0.1)
Basophils Relative: 0 % (ref 0–1)
EOS ABS: 0.7 10*3/uL (ref 0.0–0.7)
Eosinophils Relative: 8 % — ABNORMAL HIGH (ref 0–5)
HEMATOCRIT: 45.6 % (ref 36.0–46.0)
HEMOGLOBIN: 15.2 g/dL — AB (ref 12.0–15.0)
LYMPHS ABS: 2.7 10*3/uL (ref 0.7–4.0)
Lymphocytes Relative: 32 % (ref 12–46)
MCH: 31.3 pg (ref 26.0–34.0)
MCHC: 33.3 g/dL (ref 30.0–36.0)
MCV: 93.8 fL (ref 78.0–100.0)
MONO ABS: 0.3 10*3/uL (ref 0.1–1.0)
MONOS PCT: 4 % (ref 3–12)
NEUTROS PCT: 56 % (ref 43–77)
Neutro Abs: 4.7 10*3/uL (ref 1.7–7.7)
Platelets: 256 10*3/uL (ref 150–400)
RBC: 4.86 MIL/uL (ref 3.87–5.11)
RDW: 14.4 % (ref 11.5–15.5)
WBC: 8.4 10*3/uL (ref 4.0–10.5)

## 2013-08-17 LAB — I-STAT TROPONIN, ED: Troponin i, poc: 0 ng/mL (ref 0.00–0.08)

## 2013-08-17 LAB — PRO B NATRIURETIC PEPTIDE: Pro B Natriuretic peptide (BNP): 25.6 pg/mL (ref 0–125)

## 2013-08-17 MED ORDER — IOHEXOL 350 MG/ML SOLN
100.0000 mL | Freq: Once | INTRAVENOUS | Status: AC | PRN
  Administered 2013-08-17: 100 mL via INTRAVENOUS

## 2013-08-17 MED ORDER — ALBUTEROL SULFATE HFA 108 (90 BASE) MCG/ACT IN AERS
2.0000 | INHALATION_SPRAY | Freq: Once | RESPIRATORY_TRACT | Status: DC
  Filled 2013-08-17: qty 6.7

## 2013-08-17 MED ORDER — ALBUTEROL SULFATE HFA 108 (90 BASE) MCG/ACT IN AERS: 2.0000 | INHALATION_SPRAY | RESPIRATORY_TRACT | Status: DC | PRN

## 2013-08-17 MED ORDER — PREDNISONE 20 MG PO TABS: ORAL_TABLET | ORAL | Status: DC

## 2013-08-17 MED ORDER — IPRATROPIUM-ALBUTEROL 0.5-2.5 (3) MG/3ML IN SOLN
3.0000 mL | Freq: Once | RESPIRATORY_TRACT | Status: AC
  Administered 2013-08-17: 3 mL via RESPIRATORY_TRACT
  Filled 2013-08-17: qty 3

## 2013-08-17 NOTE — Progress Notes (Signed)
  CARE MANAGEMENT ED NOTE 08/17/2013  Patient:  Andrea Townsend,Andrea Townsend   Account Number:  [id number]  Date Initiated:  08/17/2013  Documentation initiated by:  Edd ArbourGIBBS,KIMBERLY  Subjective/Objective Assessment:   6653 medicare pt with no pcp     Subjective/Objective Assessment Detail:   pcp c fulp     Action/Plan:   epic updated   Action/Plan Detail:   Anticipated DC Date:  08/17/2013     Status Recommendation to Physician:   Result of Recommendation:    Other ED Services  Consult Working Plan    DC Planning Services  Other  Outpatient Services - Pt will follow up    Choice offered to / List presented to:            Status of service:  Completed, signed off  ED Comments:   ED Comments Detail:

## 2013-08-17 NOTE — Discharge Instructions (Signed)
Bronchospasm A bronchospasm is when the tubes that carry air in and out of your lungs (airways) spasm or tighten. During a bronchospasm it is hard to breathe. This is because the airways get smaller. A bronchospasm can be triggered by:  Allergies. These may be to animals, pollen, food, or mold.  Infection. This is a common cause of bronchospasm.  Exercise.  Irritants. These include pollution, cigarette smoke, strong odors, aerosol sprays, and paint fumes.  Weather changes.  Stress.  Being emotional. HOME CARE   Always have a plan for getting help. Know when to call your doctor and local emergency services (911 in the [place] Know where you can get emergency care.  Only take medicines as told by your doctor.  If you were prescribed an inhaler or nebulizer machine, ask your doctor how to use it correctly. Always use a spacer with your inhaler if you were given one.  Stay calm during an attack. Try to relax and breathe more slowly.  Control your home environment:  Change your heating and air conditioning filter at least once a month.  Limit your use of fireplaces and wood stoves.  Do not  smoke. Do not  allow smoking in your home.  Avoid perfumes and fragrances.  Get rid of pests (such as roaches and mice) and their droppings.  Throw away plants if you see mold on them.  Keep your house clean and dust free.  Replace carpet with wood, tile, or vinyl flooring. Carpet can trap dander and dust.  Use allergy-proof pillows, mattress covers, and box spring covers.  Wash bed sheets and blankets every week in hot water. Dry them in a dryer.  Use blankets that are made of polyester or cotton.  Wash hands frequently. GET HELP IF:  You have muscle aches.  You have chest pain.  The thick spit you spit or cough up (sputum) changes from clear or white to yellow, green, gray, or bloody.  The thick spit you spit or cough up gets thicker.  There are problems that may be related  to the medicine you are given such as:  A rash.  Itching.  Swelling.  Trouble breathing. GET HELP RIGHT AWAY IF:  You feel you cannot breathe or catch your breath.  You cannot stop coughing.  Your treatment is not helping you breathe better.  You have very bad chest pain. MAKE SURE YOU:   Understand these instructions.  Will watch your condition.  Will get help right away if you are not doing well or get worse. Document Released: 12/03/2008 Document Revised: 02/10/2013 Document Reviewed: 07/29/2012 ExitCare Patient Information 2015 [employer]. This information is not intended to replace advice given to you by your health care provider. Make sure you discuss any questions you have with your health care provider.  

## 2013-08-17 NOTE — ED Provider Notes (Signed)
CSN: [id number]     Arrival date & time 08/17/13  1250 History   First MD Initiated Contact with Patient 08/17/13 1640     Chief Complaint  Patient presents with  . Shortness of Breath     (Consider location/radiation/quality/duration/timing/severity/associated sxs/prior Treatment) Patient is a 53 y.o. female presenting with cough. The history is provided by the patient. No language interpreter was used.  Cough Cough characteristics:  Productive Sputum characteristics:  Nondescript Duration: 5 months. Timing:  Intermittent Progression:  Worsening Chronicity:  Chronic Smoker: no (former)   Relieved by:  Nothing Ineffective treatments:  Cough suppressants (antibiotics) Associated symptoms: chest pain (with cough only), chills and shortness of breath   Associated symptoms: no diaphoresis, no eye discharge, no fever, no headaches, no rhinorrhea, no sinus congestion, no sore throat and no weight loss   Associated symptoms comment:  Night sweats   Past Medical History  Diagnosis Date  . Stroke    Past Surgical History  Procedure Laterality Date  . Cesarean section    . Abdominal surgery     History reviewed. No pertinent family history. History  Substance Use Topics  . Smoking status: Former Games developermoker  . Smokeless tobacco: Not on file  . Alcohol Use: No   OB History   Grav Para Term Preterm Abortions TAB SAB Ect Mult Living                 Review of Systems  Constitutional: Positive for chills. Negative for fever, weight loss, diaphoresis, activity change, appetite change and fatigue.  HENT: Negative for congestion, facial swelling, rhinorrhea and sore throat.   Eyes: Negative for photophobia and discharge.  Respiratory: Positive for cough and shortness of breath. Negative for chest tightness.   Cardiovascular: Positive for chest pain (with cough only). Negative for palpitations and leg swelling.  Gastrointestinal: Negative for nausea, vomiting, abdominal pain and  diarrhea.  Endocrine: Negative for polydipsia and polyuria.  Genitourinary: Negative for dysuria, frequency, difficulty urinating and pelvic pain.  Musculoskeletal: Negative for arthralgias, back pain, neck pain and neck stiffness.  Skin: Negative for color change and wound.  Allergic/Immunologic: Negative for immunocompromised state.  Neurological: Negative for facial asymmetry, weakness, numbness and headaches.  Hematological: Does not bruise/bleed easily.  Psychiatric/Behavioral: Negative for confusion and agitation.      Allergies  Review of patient's allergies indicates no known allergies.  Home Medications   Prior to Admission medications   Medication Sig Start Date End Date Taking? Authorizing Provider  Biotin 1000 MCG tablet Take 1,000 mcg by mouth 3 (three) times daily.   Yes Historical Provider, MD  Multiple Vitamin (MULTIVITAMIN WITH MINERALS) TABS tablet Take 1 tablet by mouth daily.   Yes Historical Provider, MD  omega-3 acid ethyl esters (LOVAZA) 1 G capsule Take 1 capsule by mouth 2 (two) times daily.   Yes Historical Provider, MD  OVER THE COUNTER MEDICATION Take 1 each by mouth daily.   Yes Historical Provider, MD  albuterol (PROVENTIL HFA;VENTOLIN HFA) 108 (90 BASE) MCG/ACT inhaler Inhale 2 puffs into the lungs every 4 (four) hours as needed for wheezing or shortness of breath. 08/17/13   Shanna CiscoMegan E Docherty, MD  predniSONE (DELTASONE) 20 MG tablet 3 tabs po day one, then 2 po daily x 4 days 08/17/13   Shanna CiscoMegan E Docherty, MD   BP 109/77  Pulse 80  Temp(Src) 97.9 F (36.6 C) (Oral)  Resp 18  SpO2 97% Physical Exam  Constitutional: She is oriented to person, place, and time.  She appears well-developed and well-nourished. No distress.  HENT:  Head: Normocephalic and atraumatic.  Mouth/Throat: No oropharyngeal exudate.  Eyes: Pupils are equal, round, and reactive to light.  Neck: Normal range of motion. Neck supple.  Cardiovascular: Normal rate, regular rhythm and  normal heart sounds.  Exam reveals no gallop and no friction rub.   No murmur heard. Pulmonary/Chest: Effort normal. No respiratory distress. She has wheezes in the right upper field, the right middle field, the right lower field, the left upper field, the left middle field and the left lower field. She has no rales.  Abdominal: Soft. Bowel sounds are normal. She exhibits no distension and no mass. There is no tenderness. There is no rebound and no guarding.  Musculoskeletal: Normal range of motion. She exhibits no edema and no tenderness.  Neurological: She is alert and oriented to person, place, and time.  Skin: Skin is warm and dry.  Psychiatric: She has a normal mood and affect.    ED Course  Procedures (including critical care time) Labs Review Labs Reviewed  CBC WITH DIFFERENTIAL - Abnormal; Notable for the following:    Hemoglobin 15.2 (*)    Eosinophils Relative 8 (*)    All other components within normal limits  BASIC METABOLIC PANEL - Abnormal; Notable for the following:    Glucose, Bld 103 (*)    All other components within normal limits  BORDETELLA PERTUSSIS PCR  PRO B NATRIURETIC PEPTIDE  I-STAT TROPOININ, ED    Imaging Review Dg Chest 2 View  08/17/2013   CLINICAL DATA:  Shortness of breath and cough for 2 weeks.  EXAM: CHEST  2 VIEW  COMPARISON:  PA and lateral chest 07/03/2013. Single view of the chest 07/06/2006.  FINDINGS: The lungs are clear. Heart size is normal. There is no pneumothorax or pleural effusion.  IMPRESSION: No acute disease.   Electronically Signed   By: Drusilla Kanner M.D.   On: 08/17/2013 14:30   Ct Angio Chest Pe W/cm &/or Wo Cm  08/17/2013   CLINICAL DATA:  Chronic cough over the past 5 months, worse beginning yesterday. Shortness of breath. Chest pain.  EXAM: CT ANGIOGRAPHY CHEST WITH CONTRAST  TECHNIQUE: Multidetector CT imaging of the chest was performed using the standard protocol during bolus administration of intravenous contrast.  Multiplanar CT image reconstructions and MIPs were obtained to evaluate the vascular anatomy.  CONTRAST:  OMNIPAQUE IOHEXOL 350 MG/ML IV.  COMPARISON:  None.  FINDINGS: Contrast opacification of the pulmonary arteries is moderate to good. No filling defects within either main pulmonary artery or their branches in either lung to suggest pulmonary embolism. Heart size normal. Prominent epicardial fat. No visible coronary atherosclerosis. No pericardial effusion. No visible atherosclerosis involving the thoracic or upper abdominal aorta.  Scarring in the lingula and minimal scarring in the left lower lobe. Pulmonary parenchyma otherwise clear without localized airspace consolidation, interstitial disease, or parenchymal nodules or masses. No pleural effusions. Central airways patent with moderate bronchial wall thickening.  Scattered normal size lymph nodes in the mediastinum, hila, and axilla. No significant lymphadenopathy. Thyroid gland normal in appearance.  Very small hiatal hernia. Visualized extreme upper abdomen otherwise unremarkable. Bone window images unremarkable.  Review of the MIP images confirms the above findings.  IMPRESSION: 1. No evidence of pulmonary embolism. 2. Moderate central bronchial wall thickening consistent with bronchitis and/or asthma. No acute cardiopulmonary disease otherwise. 3. Very small hiatal hernia.   Electronically Signed   By: Kayren Eaves.D.  On: 08/17/2013 19:12     EKG Interpretation   Date/Time:  Monday August 17 2013 13:10:50 EDT Ventricular Rate:  76 PR Interval:  135 QRS Duration: 67 QT Interval:  522 QTC Calculation: 587 R Axis:   77 Text Interpretation:  Sinus rhythm Consider left atrial enlargement  Borderline repolarization abnormality Prolonged QT interval Confirmed by  DOCHERTY  MD, MEGAN ([id number]) on 08/17/2013 4:42:38 PM      MDM   Final diagnoses:  Bronchitis    Pt is a 53 y.o. female with Pmhx as above who presents with 5 months  of chronic cough with SOB for about 1 month.  She has seen multiple MDs for same w/o clear diagnosis.  BNP, trop not elevated. CBC, BMP noncontributory, CXR nml. CTA chest w/o PE, does show moderate central bronchial wall thickening c/w bronchitis &/or asthma. Pertussis swab sent. Pt feeling improved after neb treatment and wheezing also improved. Pt ambulated w/o dropping O2 sats. Will d/c home w/ trial of prednisone & albuterol MDI. Have referred to pulm. Return precautions given for new or worsening symptoms including worsening SOB.          Shanna CiscoMegan E Docherty, MD 08/17/13 2027

## 2013-08-17 NOTE — ED Notes (Signed)
Patient transported to CT 

## 2013-08-17 NOTE — ED Notes (Signed)
Per pt and spouse,  Pt has been having shortness of breath x 1 month.  Has seen MD multiple times.  Was supposed to have CT done today.  Pt has had cough.  Pt has hx of stroke.  No new stroke symptoms.  Orientation and mobility status at baseline.

## 2013-08-17 NOTE — ED Notes (Signed)
Pt was able to ambulate with O2 sats at 97-98% RM with no assistance from staff.  Pt uses a cane to ambulate

## 2013-08-17 NOTE — ED Notes (Signed)
Called lab to determine what kind of swab RN needs to test for pertussis. Lab unsure and will call back.

## 2013-08-17 NOTE — ED Notes (Signed)
Pt sts that she has had a cough x 5 months but that it got so bad last night that she couldn't sleep and was feeling SOB. Symptoms continued today. Pt A&Ox4. Pt c/o chest pain from coughing. Pt denies fever, chills, sore throat.

## 2013-09-01 LAB — BORDETELLA PERTUSSIS PCR
B PERTUSSIS, DNA: NOT DETECTED
B parapertussis, DNA: NOT DETECTED

## 2013-09-20 ENCOUNTER — Encounter (HOSPITAL_COMMUNITY): Payer: Self-pay | Admitting: Emergency Medicine

## 2013-09-20 ENCOUNTER — Emergency Department (HOSPITAL_COMMUNITY): Payer: Medicare Other

## 2013-09-20 DIAGNOSIS — J45909 Unspecified asthma, uncomplicated: Secondary | ICD-10-CM | POA: Diagnosis not present

## 2013-09-20 DIAGNOSIS — J441 Chronic obstructive pulmonary disease with (acute) exacerbation: Secondary | ICD-10-CM | POA: Diagnosis not present

## 2013-09-20 DIAGNOSIS — R1313 Dysphagia, pharyngeal phase: Secondary | ICD-10-CM | POA: Diagnosis present

## 2013-09-20 DIAGNOSIS — Z825 Family history of asthma and other chronic lower respiratory diseases: Secondary | ICD-10-CM

## 2013-09-20 DIAGNOSIS — I519 Heart disease, unspecified: Secondary | ICD-10-CM | POA: Diagnosis not present

## 2013-09-20 DIAGNOSIS — Z87891 Personal history of nicotine dependence: Secondary | ICD-10-CM | POA: Diagnosis not present

## 2013-09-20 DIAGNOSIS — I498 Other specified cardiac arrhythmias: Secondary | ICD-10-CM | POA: Diagnosis present

## 2013-09-20 DIAGNOSIS — J209 Acute bronchitis, unspecified: Secondary | ICD-10-CM | POA: Diagnosis present

## 2013-09-20 DIAGNOSIS — I6990 Unspecified sequelae of unspecified cerebrovascular disease: Secondary | ICD-10-CM | POA: Diagnosis not present

## 2013-09-20 DIAGNOSIS — J690 Pneumonitis due to inhalation of food and vomit: Secondary | ICD-10-CM | POA: Diagnosis present

## 2013-09-20 DIAGNOSIS — D72829 Elevated white blood cell count, unspecified: Secondary | ICD-10-CM | POA: Diagnosis not present

## 2013-09-20 DIAGNOSIS — R1310 Dysphagia, unspecified: Secondary | ICD-10-CM | POA: Diagnosis not present

## 2013-09-20 DIAGNOSIS — K219 Gastro-esophageal reflux disease without esophagitis: Secondary | ICD-10-CM | POA: Diagnosis present

## 2013-09-20 DIAGNOSIS — J45901 Unspecified asthma with (acute) exacerbation: Secondary | ICD-10-CM | POA: Diagnosis present

## 2013-09-20 DIAGNOSIS — R0602 Shortness of breath: Secondary | ICD-10-CM | POA: Diagnosis not present

## 2013-09-20 DIAGNOSIS — I69920 Aphasia following unspecified cerebrovascular disease: Secondary | ICD-10-CM | POA: Diagnosis not present

## 2013-09-20 DIAGNOSIS — J440 Chronic obstructive pulmonary disease with acute lower respiratory infection: Secondary | ICD-10-CM | POA: Diagnosis present

## 2013-09-20 DIAGNOSIS — Z79899 Other long term (current) drug therapy: Secondary | ICD-10-CM | POA: Diagnosis not present

## 2013-09-20 DIAGNOSIS — J9600 Acute respiratory failure, unspecified whether with hypoxia or hypercapnia: Secondary | ICD-10-CM | POA: Diagnosis present

## 2013-09-20 DIAGNOSIS — J383 Other diseases of vocal cords: Secondary | ICD-10-CM | POA: Diagnosis not present

## 2013-09-20 DIAGNOSIS — I69959 Hemiplegia and hemiparesis following unspecified cerebrovascular disease affecting unspecified side: Secondary | ICD-10-CM | POA: Diagnosis not present

## 2013-09-20 DIAGNOSIS — T17908A Unspecified foreign body in respiratory tract, part unspecified causing other injury, initial encounter: Secondary | ICD-10-CM | POA: Diagnosis not present

## 2013-09-20 MED ORDER — ALBUTEROL SULFATE (2.5 MG/3ML) 0.083% IN NEBU
5.0000 mg | INHALATION_SOLUTION | Freq: Once | RESPIRATORY_TRACT | Status: AC
  Administered 2013-09-20: 5 mg via RESPIRATORY_TRACT
  Filled 2013-09-20: qty 6

## 2013-09-20 NOTE — ED Notes (Signed)
Pt to ED with c/o shortness of breath.  Unable to walk due to shortness of breath.  Pt st's her chest feels tight.   Pt has been using inhaler without relief.  Also productive cough.

## 2013-09-21 ENCOUNTER — Inpatient Hospital Stay (HOSPITAL_COMMUNITY)
Admission: EM | Admit: 2013-09-21 | Discharge: 2013-09-23 | DRG: 190 | Disposition: A | Discharge status: Home / Self Care | Payer: Medicare Other | Attending: Internal Medicine | Admitting: Internal Medicine

## 2013-09-21 ENCOUNTER — Encounter (HOSPITAL_COMMUNITY): Payer: Self-pay | Admitting: Emergency Medicine

## 2013-09-21 ENCOUNTER — Inpatient Hospital Stay (HOSPITAL_COMMUNITY): Payer: Medicare Other

## 2013-09-21 DIAGNOSIS — J45901 Unspecified asthma with (acute) exacerbation: Secondary | ICD-10-CM | POA: Diagnosis present

## 2013-09-21 DIAGNOSIS — R000 Tachycardia, unspecified: Secondary | ICD-10-CM

## 2013-09-21 DIAGNOSIS — J383 Other diseases of vocal cords: Secondary | ICD-10-CM

## 2013-09-21 DIAGNOSIS — R1310 Dysphagia, unspecified: Secondary | ICD-10-CM | POA: Diagnosis present

## 2013-09-21 DIAGNOSIS — J9601 Acute respiratory failure with hypoxia: Secondary | ICD-10-CM

## 2013-09-21 DIAGNOSIS — J440 Chronic obstructive pulmonary disease with acute lower respiratory infection: Secondary | ICD-10-CM | POA: Diagnosis present

## 2013-09-21 DIAGNOSIS — K219 Gastro-esophageal reflux disease without esophagitis: Secondary | ICD-10-CM | POA: Diagnosis present

## 2013-09-21 DIAGNOSIS — J9600 Acute respiratory failure, unspecified whether with hypoxia or hypercapnia: Secondary | ICD-10-CM

## 2013-09-21 DIAGNOSIS — J690 Pneumonitis due to inhalation of food and vomit: Secondary | ICD-10-CM | POA: Diagnosis present

## 2013-09-21 DIAGNOSIS — Z87891 Personal history of nicotine dependence: Secondary | ICD-10-CM | POA: Diagnosis not present

## 2013-09-21 DIAGNOSIS — J441 Chronic obstructive pulmonary disease with (acute) exacerbation: Secondary | ICD-10-CM

## 2013-09-21 DIAGNOSIS — J45909 Unspecified asthma, uncomplicated: Secondary | ICD-10-CM

## 2013-09-21 DIAGNOSIS — D72829 Elevated white blood cell count, unspecified: Secondary | ICD-10-CM | POA: Diagnosis not present

## 2013-09-21 DIAGNOSIS — J449 Chronic obstructive pulmonary disease, unspecified: Secondary | ICD-10-CM | POA: Insufficient documentation

## 2013-09-21 DIAGNOSIS — I519 Heart disease, unspecified: Secondary | ICD-10-CM

## 2013-09-21 DIAGNOSIS — T17908A Unspecified foreign body in respiratory tract, part unspecified causing other injury, initial encounter: Secondary | ICD-10-CM

## 2013-09-21 DIAGNOSIS — R1313 Dysphagia, pharyngeal phase: Secondary | ICD-10-CM | POA: Diagnosis present

## 2013-09-21 DIAGNOSIS — I6990 Unspecified sequelae of unspecified cerebrovascular disease: Secondary | ICD-10-CM

## 2013-09-21 DIAGNOSIS — Z825 Family history of asthma and other chronic lower respiratory diseases: Secondary | ICD-10-CM | POA: Diagnosis not present

## 2013-09-21 DIAGNOSIS — I69920 Aphasia following unspecified cerebrovascular disease: Secondary | ICD-10-CM | POA: Diagnosis not present

## 2013-09-21 DIAGNOSIS — I498 Other specified cardiac arrhythmias: Secondary | ICD-10-CM

## 2013-09-21 DIAGNOSIS — Z79899 Other long term (current) drug therapy: Secondary | ICD-10-CM | POA: Diagnosis not present

## 2013-09-21 DIAGNOSIS — I69959 Hemiplegia and hemiparesis following unspecified cerebrovascular disease affecting unspecified side: Secondary | ICD-10-CM | POA: Diagnosis not present

## 2013-09-21 HISTORY — DX: Bronchitis, not specified as acute or chronic: J40

## 2013-09-21 LAB — MRSA PCR SCREENING: MRSA by PCR: NEGATIVE

## 2013-09-21 LAB — CBC WITH DIFFERENTIAL/PLATELET
BASOS ABS: 0 10*3/uL (ref 0.0–0.1)
Basophils Relative: 0 % (ref 0–1)
Eosinophils Absolute: 0.9 10*3/uL — ABNORMAL HIGH (ref 0.0–0.7)
Eosinophils Relative: 11 % — ABNORMAL HIGH (ref 0–5)
HEMATOCRIT: 42.7 % (ref 36.0–46.0)
HEMOGLOBIN: 13.9 g/dL (ref 12.0–15.0)
LYMPHS PCT: 37 % (ref 12–46)
Lymphs Abs: 3.1 10*3/uL (ref 0.7–4.0)
MCH: 31.1 pg (ref 26.0–34.0)
MCHC: 32.6 g/dL (ref 30.0–36.0)
MCV: 95.5 fL (ref 78.0–100.0)
MONO ABS: 0.4 10*3/uL (ref 0.1–1.0)
Monocytes Relative: 5 % (ref 3–12)
NEUTROS PCT: 47 % (ref 43–77)
Neutro Abs: 4 10*3/uL (ref 1.7–7.7)
Platelets: 286 10*3/uL (ref 150–400)
RBC: 4.47 MIL/uL (ref 3.87–5.11)
RDW: 15.1 % (ref 11.5–15.5)
WBC: 8.4 10*3/uL (ref 4.0–10.5)

## 2013-09-21 LAB — I-STAT TROPONIN, ED: Troponin i, poc: 0 ng/mL (ref 0.00–0.08)

## 2013-09-21 LAB — I-STAT CHEM 8, ED
BUN: 11 mg/dL (ref 6–23)
CREATININE: 0.9 mg/dL (ref 0.50–1.10)
Calcium, Ion: 1.21 mmol/L (ref 1.12–1.23)
Chloride: 107 mEq/L (ref 96–112)
Glucose, Bld: 105 mg/dL — ABNORMAL HIGH (ref 70–99)
HCT: 46 % (ref 36.0–46.0)
HEMOGLOBIN: 15.6 g/dL — AB (ref 12.0–15.0)
POTASSIUM: 4 meq/L (ref 3.7–5.3)
SODIUM: 141 meq/L (ref 137–147)
TCO2: 24 mmol/L (ref 0–100)

## 2013-09-21 LAB — PROCALCITONIN

## 2013-09-21 MED ORDER — SODIUM CHLORIDE 0.9 % IV SOLN
INTRAVENOUS | Status: DC
  Administered 2013-09-21: 11:00:00 via INTRAVENOUS

## 2013-09-21 MED ORDER — DEXTROSE 5 % IV SOLN
500.0000 mg | INTRAVENOUS | Status: DC
  Filled 2013-09-21: qty 500

## 2013-09-21 MED ORDER — OMEGA-3-ACID ETHYL ESTERS 1 G PO CAPS
1.0000 | ORAL_CAPSULE | Freq: Two times a day (BID) | ORAL | Status: DC
  Administered 2013-09-21 – 2013-09-23 (×5): 1 g via ORAL
  Filled 2013-09-21 (×6): qty 1

## 2013-09-21 MED ORDER — DEXTROSE 5 % IV SOLN
1.0000 g | INTRAVENOUS | Status: DC
  Filled 2013-09-21: qty 10

## 2013-09-21 MED ORDER — ONDANSETRON HCL 4 MG PO TABS: 4.0000 mg | ORAL_TABLET | Freq: Four times a day (QID) | ORAL | Status: DC | PRN

## 2013-09-21 MED ORDER — ALBUTEROL SULFATE (2.5 MG/3ML) 0.083% IN NEBU
5.0000 mg | INHALATION_SOLUTION | Freq: Once | RESPIRATORY_TRACT | Status: AC
  Administered 2013-09-21: 5 mg via RESPIRATORY_TRACT
  Filled 2013-09-21: qty 6

## 2013-09-21 MED ORDER — ALBUTEROL SULFATE (2.5 MG/3ML) 0.083% IN NEBU: 2.5000 mg | INHALATION_SOLUTION | RESPIRATORY_TRACT | Status: DC | PRN

## 2013-09-21 MED ORDER — METHYLPREDNISOLONE SODIUM SUCC 125 MG IJ SOLR
125.0000 mg | Freq: Once | INTRAMUSCULAR | Status: AC
  Administered 2013-09-21: 125 mg via INTRAVENOUS
  Filled 2013-09-21: qty 2

## 2013-09-21 MED ORDER — PREDNISONE 20 MG PO TABS
40.0000 mg | ORAL_TABLET | Freq: Every day | ORAL | Status: DC
  Administered 2013-09-22 – 2013-09-23 (×2): 40 mg via ORAL
  Filled 2013-09-21 (×3): qty 2

## 2013-09-21 MED ORDER — ACETAMINOPHEN 650 MG RE SUPP: 650.0000 mg | Freq: Four times a day (QID) | RECTAL | Status: DC | PRN

## 2013-09-21 MED ORDER — OXYCODONE HCL 5 MG PO TABS: 5.0000 mg | ORAL_TABLET | ORAL | Status: DC | PRN

## 2013-09-21 MED ORDER — ENOXAPARIN SODIUM 30 MG/0.3ML ~~LOC~~ SOLN
30.0000 mg | SUBCUTANEOUS | Status: DC
  Administered 2013-09-21 – 2013-09-22 (×2): 30 mg via SUBCUTANEOUS
  Filled 2013-09-21 (×3): qty 0.3

## 2013-09-21 MED ORDER — LEVOFLOXACIN IN D5W 750 MG/150ML IV SOLN
750.0000 mg | INTRAVENOUS | Status: DC
  Administered 2013-09-21: 750 mg via INTRAVENOUS
  Filled 2013-09-21 (×2): qty 150

## 2013-09-21 MED ORDER — LORATADINE 10 MG PO TABS
10.0000 mg | ORAL_TABLET | Freq: Every day | ORAL | Status: DC
  Administered 2013-09-21 – 2013-09-23 (×3): 10 mg via ORAL
  Filled 2013-09-21 (×3): qty 1

## 2013-09-21 MED ORDER — PANTOPRAZOLE SODIUM 40 MG PO TBEC
40.0000 mg | DELAYED_RELEASE_TABLET | Freq: Two times a day (BID) | ORAL | Status: DC
  Administered 2013-09-21 – 2013-09-23 (×4): 40 mg via ORAL
  Filled 2013-09-21 (×4): qty 1

## 2013-09-21 MED ORDER — ALBUTEROL SULFATE (2.5 MG/3ML) 0.083% IN NEBU: 10.0000 mg | INHALATION_SOLUTION | Freq: Once | RESPIRATORY_TRACT | Status: DC

## 2013-09-21 MED ORDER — HYDROMORPHONE HCL PF 1 MG/ML IJ SOLN: 0.5000 mg | INTRAMUSCULAR | Status: DC | PRN

## 2013-09-21 MED ORDER — METHYLPREDNISOLONE SODIUM SUCC 125 MG IJ SOLR
60.0000 mg | Freq: Four times a day (QID) | INTRAMUSCULAR | Status: DC
Start: 2013-09-21 — End: 2013-09-21
  Administered 2013-09-21: 60 mg via INTRAVENOUS
  Filled 2013-09-21 (×3): qty 0.96

## 2013-09-21 MED ORDER — DEXTROSE 5 % IV SOLN
1.0000 g | Freq: Once | INTRAVENOUS | Status: DC
  Filled 2013-09-21: qty 10

## 2013-09-21 MED ORDER — ONDANSETRON HCL 4 MG/2ML IJ SOLN: 4.0000 mg | Freq: Four times a day (QID) | INTRAMUSCULAR | Status: DC | PRN

## 2013-09-21 MED ORDER — PANTOPRAZOLE SODIUM 40 MG PO TBEC
40.0000 mg | DELAYED_RELEASE_TABLET | Freq: Every day | ORAL | Status: DC
  Administered 2013-09-21: 40 mg via ORAL
  Filled 2013-09-21: qty 1

## 2013-09-21 MED ORDER — IPRATROPIUM-ALBUTEROL 0.5-2.5 (3) MG/3ML IN SOLN: 3.0000 mL | Freq: Four times a day (QID) | RESPIRATORY_TRACT | Status: DC

## 2013-09-21 MED ORDER — ACETAMINOPHEN 325 MG PO TABS: 650.0000 mg | ORAL_TABLET | Freq: Four times a day (QID) | ORAL | Status: DC | PRN

## 2013-09-21 MED ORDER — ALBUTEROL (5 MG/ML) CONTINUOUS INHALATION SOLN
10.0000 mg | INHALATION_SOLUTION | RESPIRATORY_TRACT | Status: DC
  Administered 2013-09-21: 10 mg via RESPIRATORY_TRACT

## 2013-09-21 MED ORDER — DEXTROSE 5 % IV SOLN
500.0000 mg | Freq: Once | INTRAVENOUS | Status: AC
  Administered 2013-09-21: 500 mg via INTRAVENOUS
  Filled 2013-09-21: qty 500

## 2013-09-21 MED ORDER — IPRATROPIUM-ALBUTEROL 0.5-2.5 (3) MG/3ML IN SOLN
3.0000 mL | Freq: Three times a day (TID) | RESPIRATORY_TRACT | Status: DC
  Administered 2013-09-21 – 2013-09-23 (×5): 3 mL via RESPIRATORY_TRACT
  Filled 2013-09-21 (×5): qty 3

## 2013-09-21 MED ORDER — MAGNESIUM SULFATE 40 MG/ML IJ SOLN
2.0000 g | Freq: Once | INTRAMUSCULAR | Status: AC
  Administered 2013-09-21: 2 g via INTRAVENOUS
  Filled 2013-09-21: qty 50

## 2013-09-21 MED ORDER — IPRATROPIUM-ALBUTEROL 0.5-2.5 (3) MG/3ML IN SOLN
3.0000 mL | RESPIRATORY_TRACT | Status: DC
  Administered 2013-09-21 (×3): 3 mL via RESPIRATORY_TRACT
  Filled 2013-09-21 (×3): qty 3

## 2013-09-21 MED ORDER — IPRATROPIUM BROMIDE 0.02 % IN SOLN
0.5000 mg | Freq: Once | RESPIRATORY_TRACT | Status: AC
  Administered 2013-09-21: 0.5 mg via RESPIRATORY_TRACT
  Filled 2013-09-21: qty 2.5

## 2013-09-21 MED ORDER — ALUM & MAG HYDROXIDE-SIMETH 200-200-20 MG/5ML PO SUSP: 30.0000 mL | Freq: Four times a day (QID) | ORAL | Status: DC | PRN

## 2013-09-21 MED ORDER — METHYLPREDNISOLONE SODIUM SUCC 125 MG IJ SOLR
125.0000 mg | Freq: Two times a day (BID) | INTRAMUSCULAR | Status: DC
  Administered 2013-09-21: 125 mg via INTRAVENOUS
  Filled 2013-09-21 (×3): qty 2

## 2013-09-21 NOTE — Progress Notes (Signed)
Utilization review completed.  

## 2013-09-21 NOTE — Evaluation (Signed)
Clinical/Bedside Swallow Evaluation Patient Details  Name: Andrea Townsend MRN: [id number] Date of Birth: 02-May-1960  Today's Date: 09/21/2013 Time: 0932-1020 SLP Time Calculation (min): 48 min  Past Medical History:  Past Medical History  Diagnosis Date  . Stroke   . Bronchitis    Past Surgical History:  Past Surgical History  Procedure Laterality Date  . Cesarean section    . Abdominal surgery     HPI:  53 year old female known to this SLP, presents with cough and SOB, and c/o respiratory illness over past 6 months, requiring several rounds of antibiotics and steroids.  Pt/husband deny dysphagia, but upon further questioning, husband states pt does cough frequently during meals.  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  Receptive language is intact.  Pt communicates with gestures, facial expressions, and answers yes/no questions.   Assessment / Plan / Recommendation Clinical Impression  Pt shows no overt s/s of aspiration at bedside, but husband reports pt does cough frequently during meals.  Will complete a MBS for objective evaluation of current swallow function and to r/o silent aspiration.    Aspiration Risk  Mild    Diet Recommendation Regular;Thin liquid   Medication Administration: Whole meds with liquid Supervision: Patient able to self feed;Intermittent supervision to cue for compensatory strategies Compensations: Slow rate;Small sips/bites Postural Changes and/or Swallow Maneuvers: Seated upright 90 degrees    Other  Recommendations Recommended Consults: MBS Oral Care Recommendations: Oral care BID Other Recommendations: Clarify dietary restrictions   Follow Up Recommendations  Other (comment) (TBD)    Frequency and Duration        Pertinent Vitals/Pain CXR: No active disease; afebrile; LS: Expiratory wheeze     Swallow Study Prior Functional Status       General HPI: 53 year old female known to this SLP,  presents with cough and SOB, and c/o respiratory illness over past 6 months, requiring several rounds of antibiotics and steroids.  Pt/husband deny dysphagia, but upon further questioning, husband states pt does cough frequently during meals.  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  Receptive language is intact.  Pt communicates with gestures, facial expressions, and answers yes/no questions. Type of Study: Bedside swallow evaluation Previous Swallow Assessment: MBS 2008 (unable to access results from old system).  Pt does have a hx. of dysphagia following her stroke. Diet Prior to this Study: Regular;Thin liquids Temperature Spikes Noted: No Respiratory Status: Room air History of Recent Intubation: No Behavior/Cognition: Alert;Cooperative;Pleasant mood Oral Cavity - Dentition: Adequate natural dentition Self-Feeding Abilities: Able to feed self Patient Positioning: Upright in bed Baseline Vocal Quality: Clear Volitional Swallow: Able to elicit    Oral/Motor/Sensory Function Overall Oral Motor/Sensory Function: Appears within functional limits for tasks assessed   Ice Chips     Thin Liquid Thin Liquid: Within functional limits Presentation: Cup;Straw;Self Fed    Nectar Thick Nectar Thick Liquid: Not tested   Honey Thick Honey Thick Liquid: Not tested   Puree Puree: Within functional limits Presentation: Self Fed;Spoon   Solid   GO    Solid: Within functional limits Presentation: Self Andrea GravelFed       Andrea Townsend T 09/21/2013,10:32 AM

## 2013-09-21 NOTE — Progress Notes (Signed)
ANTIBIOTIC CONSULT NOTE - INITIAL  Pharmacy Consult for levaquin Indication: pneumonia  No Known Allergies  Patient Measurements: Height: 5\' 4"  (162.6 cm) Weight: 219 lb 2.2 oz (99.4 kg) IBW/kg (Calculated) : 54.7   Vital Signs: Temp: 97.8 F (36.6 C) (08/03 1922) Temp src: Oral (08/03 1922) BP: 145/89 mmHg (08/03 1922) Pulse Rate: 114 (08/03 1922) Intake/Output from previous day:   Intake/Output from this shift: Total I/O In: -  Out: 550 [Urine:550]  Labs:  Recent Labs  09/21/13 0248 09/21/13 0257  WBC 8.4  --   HGB 13.9 15.6*  PLT 286  --   CREATININE  --  0.90   Estimated Creatinine Clearance: 82.9 ml/min (by C-G formula based on Cr of 0.9). No results found for this basename: VANCOTROUGH, Leodis BinetVANCOPEAK, VANCORANDOM, GENTTROUGH, GENTPEAK, GENTRANDOM, TOBRATROUGH, TOBRAPEAK, TOBRARND, AMIKACINPEAK, AMIKACINTROU, AMIKACIN,  in the last 72 hours   Microbiology: Recent Results (from the past 720 hour(s))  MRSA PCR SCREENING     Status: None   Collection Time    09/21/13 10:34 AM      Result Value Ref Range Status   MRSA by PCR NEGATIVE  NEGATIVE Final   Comment:            The GeneXpert MRSA Assay (FDA     approved for NASAL specimens     only), is one component of a     comprehensive MRSA colonization     surveillance program. It is not     intended to diagnose MRSA     infection nor to guide or     monitor treatment for     MRSA infections.    Medical History: Past Medical History  Diagnosis Date  . Stroke   . Bronchitis     Medications:  Prescriptions prior to admission  Medication Sig Dispense Refill  . albuterol (PROVENTIL HFA;VENTOLIN HFA) 108 (90 BASE) MCG/ACT inhaler Inhale 2 puffs into the lungs every 4 (four) hours as needed for wheezing or shortness of breath.  1 each  0  . Biotin 1000 MCG tablet Take 1,000 mcg by mouth 3 (three) times daily.      . Multiple Vitamin (MULTIVITAMIN WITH MINERALS) TABS tablet Take 1 tablet by mouth daily.       Marland Kitchen. omega-3 acid ethyl esters (LOVAZA) 1 G capsule Take 1 capsule by mouth 2 (two) times daily.       Assessment: 53 yo lady admitted with SOB and wheezing.  She initially received azithromycin and ceftriaxone, now changed to levaquin.  Goal of Therapy:  Eradication of infection  Plan:  Levaquin 750 mg IV q24 hours F/u clinical course and cultures  Thanks for allowing pharmacy to be a part of this patient's care.  Talbert CageLora Shauntavia Brackin, PharmD Clinical Pharmacist, [phone]  09/21/2013,7:47 PM

## 2013-09-21 NOTE — H&P (Signed)
[facility] Admission History and Physical       Andrea Townsend ZOX:[id number] DOB: Jun 26, 1960 DOA: 09/21/2013  Referring physician:  EDP PCP: Cain Saupe, MD  Specialists:   Chief Complaint:  SOb and Wheezing  HPI: Andrea Townsend is a 53 y.o. female with a history of a CVA with residual Expressive Aphasia, and Right sided Hemiparesis who presents to the ED with complaints of  worsening SOB and wheezing over the past week.   She has had a cough which has been productive of whitish sputum.  She has not had fevers or chills.   Since she has expressive aphasia from her past CVA and can only say yes or no, her husband is here and is assisting with her medical history.    He reports that she has had intermittent bouts of respiratory illness with wheezing over the past 6 month.  She had no similar problems prior to this.   She has not had a diagnosis of COPD or Asthma.   She has had multiple courses of antibiotics and steroids during this  6 month period.   She denies having any dysphagia or choking spells.  She does report having some GERD symptoms.    OVEr the past month she has also had pruritis, and has been taking benadryl for her symptoms, she has not had any new medications, and does not know what could be causing her allergic symptoms.     In the ED, she was found to have diffuse expiratory Wheezes, and was administered several nebulizer treatments, and IV solumedrol without significant improvement and was referred for medical admission.  IV Magnesium was also ordered  After she was admitted.       Review of Systems:  Per the Husband's Report Constitutional: No Weight Loss, No Weight Gain, Night Sweats, Fevers, Chills, Dizziness, Fatigue, or Generalized Weakness HEENT: No Headaches, Difficulty Swallowing,Tooth/Dental Problems,Sore Throat,  No Sneezing, Rhinitis, Ear Ache, Nasal Congestion, or Post Nasal Drip,  Cardio-vascular:  No Chest pain, Orthopnea, PND, Edema in Lower  Extremities, Anasarca, Dizziness, Palpitations  Resp: +Dyspnea, No DOE, +Productive Cough, No Hemoptysis, +Wheezing.    GI: No Heartburn, Indigestion, Abdominal Pain, Nausea, Vomiting, Diarrhea, Hematemesis, Hematochezia, Melena, Change in Bowel Habits,  Loss of Appetite  GU: No Dysuria, Change in Color of Urine, No Urgency or Frequency, No Flank pain.  Musculoskeletal: No Joint Pain or Swelling, No Decreased Range of Motion, No Back Pain.  Neurologic: No Syncope, No Seizures, +Right sided Weakness, and +Expressive Aphasia,   Muscle Weakness, Paresthesia, Vision Disturbance or Loss, No Diplopia, No Vertigo, No Difficulty Walking,  Skin: No Rash or Lesions. Psych: No Change in Mood or Affect, No Depression or Anxiety, No Memory loss, No Confusion, or Hallucinations   Past Medical History  Diagnosis Date  . Stroke   . Bronchitis     Past Surgical History  Procedure Laterality Date  . Cesarean section    . Abdominal surgery       Prior to Admission medications   Medication Sig Start Date End Date Taking? Authorizing Provider  albuterol (PROVENTIL HFA;VENTOLIN HFA) 108 (90 BASE) MCG/ACT inhaler Inhale 2 puffs into the lungs every 4 (four) hours as needed for wheezing or shortness of breath. 08/17/13  Yes Shanna Cisco, MD  Biotin 1000 MCG tablet Take 1,000 mcg by mouth 3 (three) times daily.   Yes Historical Provider, MD  Multiple Vitamin (MULTIVITAMIN WITH MINERALS) TABS tablet Take 1 tablet by mouth daily.   Yes  Historical Provider, MD  omega-3 acid ethyl esters (LOVAZA) 1 G capsule Take 1 capsule by mouth 2 (two) times daily.   Yes Historical Provider, MD    No Known Allergies   Social History:  Married,  Walks with a Gilmer MorCane  reports that she has quit smoking. She does not have any smokeless tobacco history on file. She reports that she does not drink alcohol or use illicit drugs.     History reviewed. No pertinent family history.     Physical Exam:  GEN:  Pleasant Well  Nourished and Well Developed  53 y.o. African American female examined and in no acute distress; cooperative with exam Filed Vitals:   09/21/13 0400 09/21/13 0500 09/21/13 0535 09/21/13 0615  BP: 128/68 129/85  155/87  Pulse: 106 114  120  Temp:    97.8 F (36.6 C)  TempSrc:    Oral  Resp: 27 23  20   Height:    5\' 4"  (1.626 m)  Weight:    99.4 kg (219 lb 2.2 oz)  SpO2: 95% 95% 96% 97%   Blood pressure 155/87, pulse 120, temperature 97.8 F (36.6 C), temperature source Oral, resp. rate 20, height 5\' 4"  (1.626 m), weight 99.4 kg (219 lb 2.2 oz), SpO2 97.00%. PSYCH: She is alert and oriented x4; does not appear anxious does not appear depressed; affect is normal HEENT: Normocephalic and Atraumatic, Mucous membranes pink; PERRLA; EOM intact; Fundi:  Benign;  No scleral icterus, Nares: Patent, Oropharynx: Clear, Fair Dentition, Neck:  FROM, No Cervical Lymphadenopathy nor Thyromegaly or Carotid Bruit; No JVD; Breasts:: Not examined CHEST WALL: No tenderness CHEST: Unlabored Breathing, with Decreased  Breath sounds, with Diffuse Expiratory wheezes, Occasional rhonchi.   HEART: Tachycardic, Regular rate; no murmurs rubs or gallops BACK: No kyphosis or scoliosis; No CVA tenderness ABDOMEN: Positive Bowel Sounds, Soft Non-Tender; No Masses, No Organomegaly. Rectal Exam: Not done EXTREMITIES: + Contracted Right Hand,   No Cyanosis, Clubbing, or Edema; No Ulcerations. Genitalia: not examined PULSES: 2+ and symmetric SKIN: Normal hydration no rash or ulceration CNS: alert and Oriented x 4,  No Acute Focal Deficits,  Chronic Expressive Aphasia, and Right Sided Hemiparesis   Vascular: pulses palpable throughout    Labs on Admission:  Basic Metabolic Panel:  Recent Labs Lab 09/21/13 0257  NA 141  K 4.0  CL 107  GLUCOSE 105*  BUN 11  CREATININE 0.90   Liver Function Tests: No results found for this basename: AST, ALT, ALKPHOS, BILITOT, PROT, ALBUMIN,  in the last 168 hours No results  found for this basename: LIPASE, AMYLASE,  in the last 168 hours No results found for this basename: AMMONIA,  in the last 168 hours CBC:  Recent Labs Lab 09/21/13 0248 09/21/13 0257  WBC 8.4  --   NEUTROABS 4.0  --   HGB 13.9 15.6*  HCT 42.7 46.0  MCV 95.5  --   PLT 286  --    Cardiac Enzymes: No results found for this basename: CKTOTAL, CKMB, CKMBINDEX, TROPONINI,  in the last 168 hours  BNP (last 3 results)  Recent Labs  08/17/13 1408  PROBNP 25.6   CBG: No results found for this basename: GLUCAP,  in the last 168 hours  Radiological Exams on Admission: Dg Chest 2 View  09/20/2013   CLINICAL DATA:  Short of breath  EXAM: CHEST  2 VIEW  COMPARISON:  08/17/2013  FINDINGS: The heart size and mediastinal contours are within normal limits. Both lungs are clear. The  visualized skeletal structures are unremarkable.  IMPRESSION: No active cardiopulmonary disease.   Electronically Signed   By: Marlan Palau M.D.   On: 09/20/2013 23:03        Assessment/Plan:   53 y.o. female with   Principal Problem:   1.   Asthmatic bronchitis   IV Steroid taper, DuoNebs, O2,    IV Rocephin and Azithromycin   Given IV Magnesium 2 grams x1   Consider Pulmonary Consult to establish diagnosis of COPD vs Asthma   2D ECHO ordered for this AM   Active Problems:   2.   Acute respiratory failure- due to #1   O2 PRN,  Monitor O2 Sats    3.   Late effects of CVA (cerebrovascular accident)-   stable    4.   Leukocytosis, unspecified- Infection vs Stress Rxn   Monitor Trend   On Empiric Abxs.       5.  Sinus Tachycardia- Due to Nebs, vs Early Sepsis   Monitor    IVFs.       6.  DVT Prophylaxis    Lovenox     Code Status:      FULL CODE Family Communication:    Husband at Bedside Disposition Plan:    Inpatient  Time spent: 74 Minutes  Ron Parker [facility] Pager [phone]   If 7AM -7PM Please Contact the Day Rounding Team MD for [facility]  If  7PM-7AM, Please Contact night-coverage  [email or web address] Password [email or web address] 09/21/2013, 7:30 AM

## 2013-09-21 NOTE — Consult Note (Signed)
PULMONARY / CRITICAL CARE MEDICINE  Name: Andrea Townsend MRN: [id number] DOB: [birth year]    ADMISSION DATE:  09/20/2013 CONSULTATION DATE: 8/3/215  REFERRING MD :  A. Hongalgi PRIMARY SERVICE:  3S  CHIEF COMPLAINT:  SOB and wheezing   BRIEF PATIENT DESCRIPTION: 53 y/o F,  former smoker, with PMH of CVA, with right-sided weakness and expressive aphasia, and bronchitis. She was admitted on 8/2 with worsening SOB with productive white sputum cough and wheezing over the last 6 months with no improvement with albuterol at home. Administered several nebulizer treatments, and IV solumedrol without significant improvement in ED. PCCM consulted for admission.  SIGNIFICANT EVENTS / STUDIES:   LINES / TUBES:  CULTURES: Sputum 8/3>>  ANTIBIOTICS: Azithromycin 8/2>> Rocephin 8/3 >>  HISTORY OF PRESENT ILLNESS:  53 y/o F, former smoker with a 41 pack year hx, with MH of CVA, with right-sided weakness and expressive aphasia, and bronchitis. Pt has been having increasing SOB and wheezing for the last 6 months, with no knew known allergens, travel, sick contacts or smoking. Was prescribed Albuterol 1 month ago and has been taking Q4 hours daily since that date with no relief. Spouse reports the only relief was provided by ABX therapy that has been provided intermittently over the last 6 months. Coughing has been worse at night with difficulty sleeping and laying down. She condones chest pain and nausea while coughing. Spouse states that the patient over uses cleaning agents, potpourri and glade plug ins and the house is always smelling " clean and strong". Mother and sister both have had similar episodes and hospitalized for these events as well. She has been coughing while eating and drinking occasionally and has been told she was at risk for aspiration post CVA. She has had pruritis x 6 months that does not resolve with benadryl. She typically drinks 4 Liters of fluids daily and urinates 10 times daily,  4 of these being nocturia. She denies any change in BM, dysphagia, odynophagia, rashes, trauma, abdominal pain, difficulty or pain with urination.   Report given beside with patient only responding yes and no to questions, due to expressive aphasia, and husband reporting the rest of the details.   PAST MEDICAL HISTORY :  Past Medical History  Diagnosis Date  . Stroke   . Bronchitis    Past Surgical History  Procedure Laterality Date  . Cesarean section    . Abdominal surgery     Prior to Admission medications   Medication Sig Start Date End Date Taking? Authorizing Provider  albuterol (PROVENTIL HFA;VENTOLIN HFA) 108 (90 BASE) MCG/ACT inhaler Inhale 2 puffs into the lungs every 4 (four) hours as needed for wheezing or shortness of breath. 08/17/13  Yes Shanna Cisco, MD  Biotin 1000 MCG tablet Take 1,000 mcg by mouth 3 (three) times daily.   Yes Historical Provider, MD  Multiple Vitamin (MULTIVITAMIN WITH MINERALS) TABS tablet Take 1 tablet by mouth daily.   Yes Historical Provider, MD  omega-3 acid ethyl esters (LOVAZA) 1 G capsule Take 1 capsule by mouth 2 (two) times daily.   Yes Historical Provider, MD   No Known Allergies  FAMILY HISTORY:  Mother and sister also have hx of frequent asthma/bronchitis exacerbation they have been hospitalized with. SOCIAL HISTORY:  reports that she quit smoking about 7 years ago. Her smoking use included Cigarettes. She has a 41 pack-year smoking history. She does not have any smokeless tobacco history on file. She reports that she does not drink alcohol or  use illicit drugs.  REVIEW OF SYSTEMS:   Constitutional: Negative for fever, chills, weight loss, malaise/fatigue and diaphoresis.  HENT: Negative for hearing loss, ear pain, nosebleeds, congestion, sore throat, neck pain, tinnitus and ear discharge.   Eyes: Negative for blurred vision, double vision, photophobia, pain, discharge and redness.  Respiratory: Negative for hemoptysis and stridor.    Cardiovascular: Negative for, palpitations, claudication, leg swelling and PND.  Gastrointestinal: Negative for heartburn, vomiting, abdominal pain, diarrhea, constipation, blood in stool and melena.  Genitourinary: Negative for dysuria, urgency, hematuria and flank pain.  Musculoskeletal: Negative for myalgias, back pain, joint pain and falls.  Skin: Negative for rash.  Neurological: Negative for dizziness, tingling, tremors, seizures, loss of consciousness, and headaches.  Endo/Heme/Allergies: Does not bruise/bleed easily.  INTERVAL HISTORY:   VITAL SIGNS: Temp:  [97.8 F (36.6 C)-98.1 F (36.7 C)] 97.9 F (36.6 C) (08/03 0700) Pulse Rate:  [68-120] 120 (08/03 0615) Resp:  [16-27] 20 (08/03 0615) BP: (113-155)/(68-87) 155/87 mmHg (08/03 0615) SpO2:  [94 %-99 %] 99 % (08/03 0921) Weight:  [180 lb (81.647 kg)-219 lb 2.2 oz (99.4 kg)] 219 lb 2.2 oz (99.4 kg) (08/03 0615)  PHYSICAL EXAMINATION: General:  Obese AA female in NAD Neuro:  alert and Oriented x 4, chronic expressive aphasia, right sided weakness, tongue and uvula deviation to the right,  HEENT:  Normocephalic and Atraumatic, MMM; PERRLA; EOM intact; No scleral icterus, Nares: Patent, Oropharynx: Clear, Fair Dentition, Neck: FROM, No Lymphadenopathy nor Thyromegaly or Carotid Bruit; No JVD.Trachea midline Cardiovascular:  Tachycardia with normal rate, no M/R/G Lungs:  Unlabored breathing with  Auditory upper airway expiratory wheezing.( post nebulizer treatment)  Abdomen:  Soft, normoactive bowl sounds with no tenderness to palpation Musculoskeletal: Right hand contracted, no cyanosis, clubbing and cap refill <3 sec.  Skin:  No rashes or edema  LABS:  Recent Labs Lab 09/21/13 0257  NA 141  K 4.0  CL 107  BUN 11  CREATININE 0.90  GLUCOSE 105*    Recent Labs Lab 09/21/13 0248 09/21/13 0257  HGB 13.9 15.6*  HCT 42.7 46.0  WBC 8.4  --   PLT 286  --    IMAGING:  Dg Chest 2 View  09/20/2013   CLINICAL DATA:   Short of breath  EXAM: CHEST  2 VIEW  COMPARISON:  08/17/2013  FINDINGS: The heart size and mediastinal contours are within normal limits. Both lungs are clear. The visualized skeletal structures are unremarkable.  IMPRESSION: No active cardiopulmonary disease.   Electronically Signed   By: Marlan Palau M.D.   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 FEES? pH probe?   Bedside spirometry with flow volume loops to evaluate for fixed airway obstruction   PCT, IgE   Increase Protonix to 40 q12h   Diet / lifestyle modifications to decrease reflux   Appreciate interesting consultation, will follow   Andrea Townsend- PA-S  09/21/2013, 10:02 AM  I have personally obtained history, examined patient, evaluated and interpreted laboratory and imaging results, reviewed medical records, formulated assessment / plan and placed orders.  Lonia FarberZUBELEVITSKIY, Andrea Worsley, MD Pulmonary and Critical Care Medicine [facility] Pager: [phone]  09/21/2013, 7:44 PM

## 2013-09-21 NOTE — Progress Notes (Signed)
Patient admitted earlier this morning. Patient interviewed and examined. History and physical note of this morning appreciated. Discussed with admitting M.D. 53 year old female with history of CVA, residual expressive aphasia and right hemiplegia, not on home oxygen, on regular diet, no prior diagnosis of COPD or asthma, former smoker, history of multiple courses of antibiotics and steroids in the last 6 months for dyspnea and wheezing, admitted for worsening dyspnea and wheezing.  Patient lying comfortably supine in bed. Vital signs remarkable for mild sinus tachycardia in the 120s (recently nebulized). Telemetry shows sinus tachycardia in the 120s. Respiratory system: Breath sounds seem to have improved. Occasional rhonchi right lung fields otherwise essentially clear. No increased work of breathing. CVS: S1 and S2 heard mild regular at the cardia. No JVD, murmurs or edema. Abdomen: Nondistended, soft and nontender. Normal bowel sounds heard. CNS: Alert. Mild facial asymmetry. Old expressive aphasia. Extremities: Grade 0 x 5 power right limbs. 5 x 5 power left limbs.  Lab work: Unremarkable. Chest x-ray without findings   Assessment and plan 1. Acute bronchitis versus COPD exacerbation: Admitted to step down unit. Treated empirically with oxygen, bronchodilator nebulizations, IV steroids, IV Rocephin and azithromycin. Pulmonology consulted-will need formal PFTs and close outpatient followup. Speech therapy consulted and recommend regular diet and thin liquids.  2. Acute Respiratory failure: Secondary to problem #1. Management as above 3. 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  4. Sinus tachycardia: Secondary to acute respiratory status and bronchodilators. Monitor on telemetry.    Marcellus ScottHONGALGI,Cinthya Bors, MD, FACP, FHM. [facility] Pager [phone]  If 7PM-7AM, please contact night-coverage [email or web address] Password [email or web address] 09/21/2013, 11:56 AM

## 2013-09-21 NOTE — Progress Notes (Signed)
*  PRELIMINARY RESULTS* Echocardiogram 2D Echocardiogram has been performed.  Andrea Townsend, Andrea Townsend 09/21/2013, 1:28 PM

## 2013-09-21 NOTE — ED Provider Notes (Addendum)
CSN: [id number]     Arrival date & time 09/20/13  2108 History   First MD Initiated Contact with Patient 09/21/13 0153     Chief Complaint  Patient presents with  . Shortness of Breath     (Consider location/radiation/quality/duration/timing/severity/associated sxs/prior Treatment) Patient is a 53 y.o. female presenting with shortness of breath. The history is provided by the patient and the spouse. The history is limited by the condition of the patient.  Shortness of Breath Severity:  Severe Onset quality:  Gradual Timing:  Constant Progression:  Worsening Chronicity:  Recurrent Context: not known allergens   Relieved by:  Nothing Worsened by:  Nothing tried Ineffective treatments:  Inhaler Associated symptoms: wheezing   Associated symptoms: no fever, no sputum production and no syncope   Wheezing:    Severity:  Severe   Onset quality:  Gradual   Timing:  Constant   Progression:  Worsening   Chronicity:  Recurrent Risk factors: no recent surgery     Past Medical History  Diagnosis Date  . Stroke   . Bronchitis    Past Surgical History  Procedure Laterality Date  . Cesarean section    . Abdominal surgery     No family history on file. History  Substance Use Topics  . Smoking status: Former Games developermoker  . Smokeless tobacco: Not on file  . Alcohol Use: No   OB History   Grav Para Term Preterm Abortions TAB SAB Ect Mult Living                 Review of Systems  Constitutional: Negative for fever.  Respiratory: Positive for shortness of breath and wheezing. Negative for sputum production.   Cardiovascular: Negative for syncope.  All other systems reviewed and are negative.     Allergies  Review of patient's allergies indicates no known allergies.  Home Medications   Prior to Admission medications   Medication Sig Start Date End Date Taking? Authorizing Provider  albuterol (PROVENTIL HFA;VENTOLIN HFA) 108 (90 BASE) MCG/ACT inhaler Inhale 2 puffs into the  lungs every 4 (four) hours as needed for wheezing or shortness of breath. 08/17/13  Yes Shanna CiscoMegan E Docherty, MD  Biotin 1000 MCG tablet Take 1,000 mcg by mouth 3 (three) times daily.   Yes Historical Provider, MD  Multiple Vitamin (MULTIVITAMIN WITH MINERALS) TABS tablet Take 1 tablet by mouth daily.   Yes Historical Provider, MD  omega-3 acid ethyl esters (LOVAZA) 1 G capsule Take 1 capsule by mouth 2 (two) times daily.   Yes Historical Provider, MD   BP 128/68  Pulse 106  Temp(Src) 98.1 F (36.7 C) (Oral)  Resp 27  Ht 5\' 3"  (1.6 m)  Wt 180 lb (81.647 kg)  BMI 31.89 kg/m2  SpO2 95% Physical Exam  Constitutional: She appears well-developed and well-nourished.  HENT:  Head: Normocephalic and atraumatic.  Mouth/Throat: Oropharynx is clear and moist.  Eyes: Conjunctivae are normal. Pupils are equal, round, and reactive to light.  Neck: Normal range of motion. Neck supple.  Cardiovascular: Normal rate, regular rhythm and intact distal pulses.   Pulmonary/Chest: Effort normal. She has wheezes. She has no rales. She exhibits no tenderness.  Abdominal: Soft. Bowel sounds are normal. There is no tenderness. There is no rebound and no guarding.  Musculoskeletal: Normal range of motion. She exhibits no edema.  Neurological: She is alert.  Skin: Skin is warm and dry. She is not diaphoretic.  Psychiatric: She has a normal mood and affect.    ED  Course  Procedures (including critical care time) Labs Review Labs Reviewed  CBC WITH DIFFERENTIAL - Abnormal; Notable for the following:    Eosinophils Relative 11 (*)    Eosinophils Absolute 0.9 (*)    All other components within normal limits  I-STAT CHEM 8, ED - Abnormal; Notable for the following:    Glucose, Bld 105 (*)    Hemoglobin 15.6 (*)    All other components within normal limits  I-STAT TROPOININ, ED    Imaging Review Dg Chest 2 View  09/20/2013   CLINICAL DATA:  Short of breath  EXAM: CHEST  2 VIEW  COMPARISON:  08/17/2013   FINDINGS: The heart size and mediastinal contours are within normal limits. Both lungs are clear. The visualized skeletal structures are unremarkable.  IMPRESSION: No active cardiopulmonary disease.   Electronically Signed   By: Marlan Palau M.D.   On: 09/20/2013 23:03     EKG Interpretation   Date/Time:  Monday September 21 2013 02:26:21 EDT Ventricular Rate:  83 PR Interval:  140 QRS Duration: 73 QT Interval:  379 QTC Calculation: 445 R Axis:   80 Text Interpretation:  Sinus rhythm Borderline repolarization abnormality  Confirmed by [facility]  MD, Deberah Adolf ([id number]) on 09/21/2013 4:34:37 AM      MDM   Final diagnoses:  COPD exacerbation    Medications  albuterol (PROVENTIL) (2.5 MG/3ML) 0.083% nebulizer solution 10 mg (10 mg Nebulization Not Given 09/21/13 0306)  albuterol (PROVENTIL,VENTOLIN) solution continuous neb (10 mg Nebulization New Bag/Given 09/21/13 0308)  cefTRIAXone (ROCEPHIN) 1 g in dextrose 5 % 50 mL IVPB (not administered)  azithromycin (ZITHROMAX) 500 mg in dextrose 5 % 250 mL IVPB (500 mg Intravenous New Bag/Given 09/21/13 0518)  albuterol (PROVENTIL) (2.5 MG/3ML) 0.083% nebulizer solution 5 mg (5 mg Nebulization Given 09/20/13 2212)  methylPREDNISolone sodium succinate (SOLU-MEDROL) 125 mg/2 mL injection 125 mg (125 mg Intravenous Given 09/21/13 0251)  albuterol (PROVENTIL) (2.5 MG/3ML) 0.083% nebulizer solution 5 mg (5 mg Nebulization Given 09/21/13 0220)  ipratropium (ATROVENT) nebulizer solution 0.5 mg (0.5 mg Nebulization Given 09/21/13 0220)  albuterol (PROVENTIL) (2.5 MG/3ML) 0.083% nebulizer solution 5 mg (5 mg Nebulization Given 09/21/13 0535)   Unable to relieve symptoms will admit to step down after continuous nebs without improvement in O2 sat still wheezing and tight.    MDM Reviewed: nursing note and vitals Interpretation: labs, ECG and x-ray Total time providing critical care: 30-74 minutes. This excludes time spent performing separately reportable procedures and  services. Consults: admitting MD  CRITICAL CARE Performed by: Jasmine Awe Total critical care time: 60 minutes Critical care time was exclusive of separately billable procedures and treating other patients. Critical care was necessary to treat or prevent imminent or life-threatening deterioration. Critical care was time spent personally by me on the following activities: development of treatment plan with patient and/or surrogate as well as nursing, discussions with consultants, evaluation of patient's response to treatment, examination of patient, obtaining history from patient or surrogate, ordering and performing treatments and interventions, ordering and review of laboratory studies, ordering and review of radiographic studies, pulse oximetry and re-evaluation of patient's condition.   Jasmine Awe, MD 09/21/13 6045  Chiron Campione K Jil Penland-Rasch, MD 09/21/13 [phone]

## 2013-09-21 NOTE — ED Notes (Signed)
Unsuccessful attempt at IV. Another RN to assess.

## 2013-09-21 NOTE — Procedures (Signed)
Objective Swallowing Evaluation: Modified Barium Swallowing Study  Patient Details  Name: Andrea Townsend MRN: [id number] Date of Birth: [birth year]  Today's Date: 09/21/2013 Time: 2440-10271310-1331 SLP Time Calculation (min): 21 min  Past Medical History:  Past Medical History  Diagnosis Date  . Stroke   . Bronchitis    Past Surgical History:  Past Surgical History  Procedure Laterality Date  . Cesarean section    . Abdominal surgery     HPI:  53 year old female known to this SLP, presents with cough and SOB, and c/o respiratory illness over past 6 months, requiring several rounds of antibiotics and steroids.  Pt/husband deny dysphagia, but upon further questioning, husband states pt does cough frequently during meals.  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  Receptive language is intact.  Pt communicates with gestures, facial expressions, and answers yes/no questions.     Assessment / Plan / Recommendation Clinical Impression  Dysphagia Diagnosis: Mild pharyngeal phase dysphagia Clinical impression: Patient exhibits good swallow function with only intermittent delay to the level of the pyriforms with thin liquids.  There was no aspiration or penetration with any consistency, and no laryngeal residue after the swallow.      Treatment Recommendation  No treatment recommended at this time    Diet Recommendation Regular;Thin liquid   Liquid Administration via: Straw;Cup Medication Administration: Whole meds with liquid Supervision: Patient able to self feed;Intermittent supervision to cue for compensatory strategies Compensations: Slow rate;Small sips/bites Postural Changes and/or Swallow Maneuvers: Seated upright 90 degrees    Other  Recommendations Recommended Consults: MBS Oral Care Recommendations: Oral care BID Other Recommendations: Clarify dietary restrictions (Heart healthy; ? Carb modified)   Follow Up Recommendations  None     Frequency and Duration        Pertinent Vitals/Pain N/A        General HPI: 53 year old female known to this SLP, presents with cough and SOB, and c/o respiratory illness over past 6 months, requiring several rounds of antibiotics and steroids.  Pt/husband deny dysphagia, but upon further questioning, husband states pt does cough frequently during meals.  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  Receptive language is intact.  Pt communicates with gestures, facial expressions, and answers yes/no questions. Type of Study: Modified Barium Swallowing Study Reason for Referral: Objectively evaluate swallowing function (r/o silent aspiration) Previous Swallow Assessment: MBS 2008 (unable to access results from old system).  Pt does have a hx. of dysphagia following her stroke. Diet Prior to this Study: Regular;Thin liquids Temperature Spikes Noted: No Respiratory Status: Room air History of Recent Intubation: No Behavior/Cognition: Alert;Cooperative;Pleasant mood Oral Cavity - Dentition: Adequate natural dentition Oral Motor / Sensory Function: Within functional limits Self-Feeding Abilities: Able to feed self Patient Positioning: Upright in chair Baseline Vocal Quality: Clear Volitional Cough: Strong Volitional Swallow: Able to elicit Anatomy: Within functional limits Pharyngeal Secretions: Not observed secondary MBS    Reason for Referral Objectively evaluate swallowing function (r/o silent aspiration)   Oral Phase Oral Preparation/Oral Phase Oral Phase: WFL   Pharyngeal Phase Pharyngeal Phase Pharyngeal Phase: Impaired Pharyngeal - Thin Pharyngeal - Thin Cup: Delayed swallow initiation;Premature spillage to pyriform sinuses (intermittent)  Cervical Esophageal Phase    GO    Cervical Esophageal Phase Cervical Esophageal Phase: WFL (Distal- Pill was slightly delayed, but cleared with 2nd swal)         Demarques Pilz T 09/21/2013, 1:40  PM

## 2013-09-22 ENCOUNTER — Telehealth: Payer: Self-pay | Admitting: *Deleted

## 2013-09-22 DIAGNOSIS — R1310 Dysphagia, unspecified: Secondary | ICD-10-CM

## 2013-09-22 LAB — IGE: IgE (Immunoglobulin E), Serum: 295.4 IU/mL — ABNORMAL HIGH (ref 0.0–180.0)

## 2013-09-22 LAB — PROCALCITONIN: Procalcitonin: 0.25 ng/mL

## 2013-09-22 MED ORDER — LEVOFLOXACIN 750 MG PO TABS
750.0000 mg | ORAL_TABLET | Freq: Every day | ORAL | Status: DC
  Administered 2013-09-22: 750 mg via ORAL
  Filled 2013-09-22 (×3): qty 1

## 2013-09-22 MED ORDER — GUAIFENESIN ER 600 MG PO TB12
600.0000 mg | ORAL_TABLET | Freq: Two times a day (BID) | ORAL | Status: DC
  Administered 2013-09-22 – 2013-09-23 (×2): 600 mg via ORAL
  Filled 2013-09-22 (×3): qty 1

## 2013-09-22 NOTE — Progress Notes (Signed)
PULMONARY / CRITICAL CARE MEDICINE  Name: Andrea Townsend MRN: [id number] DOB: August 17, 1960    ADMISSION DATE:  09/20/2013 CONSULTATION DATE: 8/3/215  REFERRING MD :  A. Hongalgi PRIMARY SERVICE:  3S  CHIEF COMPLAINT:  SOB and wheezing   BRIEF PATIENT DESCRIPTION: 53 y/o F,  former smoker, with PMH of CVA, with right-sided weakness and expressive aphasia, and bronchitis. She was admitted on 8/2 with worsening SOB with productive white sputum cough and wheezing over the last 6 months with no improvement with albuterol at home. Administered several nebulizer treatments, and IV solumedrol without significant improvement in ED. PCCM consulted for admission.  SIGNIFICANT EVENTS / STUDIES:   LINES / TUBES:  CULTURES: Sputum 8/3>>  ANTIBIOTICS: Azithromycin 8/2>> Rocephin 8/3 >>   INTERVAL HISTORY:  Dyspnea improved  VITAL SIGNS: Temp:  [97.7 F (36.5 C)-98.1 F (36.7 C)] 97.8 F (36.6 C) (08/04 1100) Pulse Rate:  [100-125] 105 (08/04 0828) Resp:  [16-27] 19 (08/04 1150) BP: (123-150)/(40-90) 141/84 mmHg (08/04 1150) SpO2:  [93 %-99 %] 98 % (08/04 0828)  PHYSICAL EXAMINATION: General:  No acute distress HEENT: NCAT, PERRL PULM: good air movement, no wheezing CV: RRR, no mgr Ab:BS+, soft, nontender Ext; warm, no edema Neuro: pleasant mood, good spirits, answers yes to all questions but appropriate interaction  LABS:  Recent Labs Lab 09/21/13 0257  NA 141  K 4.0  CL 107  BUN 11  CREATININE 0.90  GLUCOSE 105*    Recent Labs Lab 09/21/13 0248 09/21/13 0257  HGB 13.9 15.6*  HCT 42.7 46.0  WBC 8.4  --   PLT 286  --    IMAGING:  Dg Chest 2 View  09/20/2013   CLINICAL DATA:  Short of breath  EXAM: CHEST  2 VIEW  COMPARISON:  08/17/2013  FINDINGS: The heart size and mediastinal contours are within normal limits. Both lungs are clear. The visualized skeletal structures are unremarkable.  IMPRESSION: No active cardiopulmonary disease.   Electronically Signed   By:  Marlan Palauharles  Clark M.D.   On: 09/20/2013 23:03   Dg Swallowing Func-speech Pathology  09/21/2013   Lenor DerrickLori T Willis, CCC-SLP     09/21/2013  1:40 PM Objective Swallowing Evaluation: Modified Barium Swallowing Study   Patient Details  Name: Andrea Townsend MRN: [id number] Date of Birth: August 17, 1960  Today's Date: 09/21/2013 Time: 7829-56211310-1331 SLP Time Calculation (min): 21 min  Past Medical History:  Past Medical History  Diagnosis Date  . Stroke   . Bronchitis    Past Surgical History:  Past Surgical History  Procedure Laterality Date  . Cesarean section    . Abdominal surgery     HPI:  53 year old female known to this SLP, presents with cough and  SOB, and c/o respiratory illness over past 6 months, requiring  several rounds of antibiotics and steroids.  Pt/husband deny  dysphagia, but upon further questioning, husband states pt does  cough frequently during meals.  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  Receptive  language is intact.  Pt communicates with gestures, facial  expressions, and answers yes/no questions.     Assessment / Plan / Recommendation Clinical Impression  Dysphagia Diagnosis: Mild pharyngeal phase dysphagia Clinical impression: Patient exhibits good swallow function with  only intermittent delay to the level of the pyriforms with thin  liquids.  There was no aspiration or penetration with any  consistency, and no laryngeal residue after the swallow.      Treatment Recommendation  No treatment recommended at this time    Diet Recommendation Regular;Thin liquid   Liquid Administration via: Straw;Cup Medication Administration: Whole meds with liquid Supervision: Patient able to self feed;Intermittent supervision  to cue for compensatory strategies Compensations: Slow rate;Small sips/bites Postural Changes and/or Swallow Maneuvers: Seated upright 90  degrees    Other  Recommendations Recommended Consults: MBS Oral Care Recommendations: Oral care BID Other  Recommendations: Clarify dietary restrictions (Heart  healthy; ? Carb modified)   Follow Up Recommendations  None    Frequency and Duration        Pertinent Vitals/Pain N/A        General HPI: 53 year old female known to this SLP, presents with  cough and SOB, and c/o respiratory illness over past 6 months,  requiring several rounds of antibiotics and steroids.  Pt/husband  deny dysphagia, but upon further questioning, husband states pt  does cough frequently during meals.  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  Receptive  language is intact.  Pt communicates with gestures, facial  expressions, and answers yes/no questions. Type of Study: Modified Barium Swallowing Study Reason for Referral: Objectively evaluate swallowing function  (r/o silent aspiration) Previous Swallow Assessment: MBS 2008 (unable to access results  from old system).  Pt does have a hx. of dysphagia following her  stroke. Diet Prior to this Study: Regular;Thin liquids Temperature Spikes Noted: No Respiratory Status: Room air History of Recent Intubation: No Behavior/Cognition: Alert;Cooperative;Pleasant mood Oral Cavity - Dentition: Adequate natural dentition Oral Motor / Sensory Function: Within functional limits Self-Feeding Abilities: Able to feed self Patient Positioning: Upright in chair Baseline Vocal Quality: Clear Volitional Cough: Strong Volitional Swallow: Able to elicit Anatomy: Within functional limits Pharyngeal Secretions: Not observed secondary MBS    Reason for Referral Objectively evaluate swallowing function (r/o  silent aspiration)   Oral Phase Oral Preparation/Oral Phase Oral Phase: WFL   Pharyngeal Phase Pharyngeal Phase Pharyngeal Phase: Impaired Pharyngeal - Thin Pharyngeal - Thin Cup: Delayed swallow initiation;Premature  spillage to pyriform sinuses (intermittent)  Cervical Esophageal Phase    GO    Cervical Esophageal Phase Cervical Esophageal Phase: WFL (Distal- Pill was  slightly  delayed, but cleared with 2nd swal)         Willis, Lori T 09/21/2013, 1:40 PM    ASSESSMENT / PLAN:  Acute respiratory distress> resolved Her symptoms have completely resolved. Given her elevated IgE and eosinophils this may be asthma or allergic rhinitis contributing to post nasal drip and vocal cord dysfunction.  Clinical suspicion for aspiration remains high given stroke but the MBS are encouraging.    Often the two are difficult to distinguish, so the best approach as I see it would be the following:    Wean off prednisone over 2-3 days  Use Symbicort 2 puffs bid with spacer  Use albuterol prn dyspnea  F/u in pulmonary clinic with PFT  Continue PPI at home  GERD lifestyle modification changes  OK from my standpoint for discharge   Heber Gardena, MD [facility] Pager: [phone] Cell: [phone] If no response, call [phone]   09/22/2013, 2:49 PM

## 2013-09-22 NOTE — Progress Notes (Signed)
NURSING PROGRESS NOTE  Andrea Townsend [id number] Transfer Data: 09/22/2013 5:16 PM Attending Provider: Elease EtienneAnand D Hongalgi, MD WUJ:[id number]:FULP, CAMMIE, MD Code Status: Full  Andrea PattyCarolyn A Townsend is a 53 y.o. female patient transferred from 2C -No acute distress noted.  -No complaints of shortness of breath.  -No complaints of chest pain.   Cardiac Monitoring:   Blood pressure 139/88, pulse 108, temperature 98.2 F (36.8 C), temperature source Oral, resp. rate 18, height 5\' 4"  (1.626 m), weight 99.4 kg (219 lb 2.2 oz), SpO2 92.00%.   IV Fluids:  IV in place, occlusive dsg intact without redness, IV cath    Allergies:  Review of patient's allergies indicates no known allergies.  Past Medical History:   has a past medical history of Stroke and Bronchitis.  Past Surgical History:   has past surgical history that includes Cesarean section and Abdominal surgery.  Social History:   reports that she quit smoking about 7 years ago. Her smoking use included Cigarettes. She has a 41 pack-year smoking history. She does not have any smokeless tobacco history on file. She reports that she does not drink alcohol or use illicit drugs.  Skin: intact  Patient/Family orientated to room. Information packet given to patient/family. Admission inpatient armband information verified with patient/family to include name and date of birth and placed on patient arm. Side rails up x 2, fall assessment and education completed with patient/family. Patient/family able to verbalize understanding of risk associated with falls and verbalized understanding to call for assistance before getting out of bed. Call light within reach. Patient/family able to voice and demonstrate understanding of unit orientation instructions.    Will continue to evaluate and treat per MD orders.

## 2013-09-22 NOTE — Telephone Encounter (Signed)
lmomtcb x1 for pt 

## 2013-09-22 NOTE — Progress Notes (Signed)
Report received from Ma[place]Jillian, [place] for transfer to [phone]

## 2013-09-22 NOTE — Progress Notes (Signed)
PROGRESS NOTE    Andrea Townsend ZOX:[id number] DOB: August 11, 1960 DOA: 09/21/2013 PCP: Cain Saupe, MD  HPI/Brief narrative 53 year old female with history of CVA, residual expressive aphasia and right hemiplegia, not on home oxygen, on regular diet, no prior diagnosis of COPD or asthma, former smoker, history of multiple courses of antibiotics and steroids in the last 6 months for dyspnea and wheezing, admitted for worsening dyspnea and wheezing. In ED, Lab work: Unremarkable. Chest x-ray without findings    Assessment/Plan:  1. Acute bronchitis versus COPD exacerbation: Admitted to step down unit. Treated empirically with oxygen, bronchodilator nebulizations, IV steroids, IV Rocephin and azithromycin. Speech therapy consulted and recommend regular diet and thin liquids. 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 Steroids were and change to by mouth prednisone, antibiotics changed to levofloxacin. Pulmonology requesting bedside spirometry with flow volume loops to evaluate for fixed airway obstruction. Pro-calcitonin unremarkable. Protonix increased to twice a day. IgE mildly elevated. Clinically improved. Await pulmonology followup. 2. Acute Hypoxic Respiratory failure: Secondary to problem #1. Management as above. Improved  3. 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  4. Sinus tachycardia: Secondary to acute respiratory status and bronchodilators. Resolved  5. Mild pharyngeal phase dysphagia: Seen on MBSS, without evidence of aspiration. Regular diet and thin liquids.    Code Status: Full Family Communication: Discussed with spouse at bedside. Disposition Plan: Transfer to medical floor. Discharge home when medically  stable.   Consultants:  Pulmonology  Procedures:  None  Antibiotics:  IV Rocephin and azithromycin x1 dose  Oral levofloxacin   Subjective: Feels much better with improvement in dyspnea. Still has mild wheezing. Chest congested.  Objective: Filed Vitals:   09/21/13 2356 09/22/13 0356 09/22/13 0828 09/22/13 1100  BP: 146/85 123/78 150/90   Pulse: 107 100 105   Temp: 97.9 F (36.6 C) 97.7 F (36.5 C) 98.1 F (36.7 C) 97.8 F (36.6 C)  TempSrc: Oral Oral Oral Oral  Resp: 17 27 19    Height:      Weight:      SpO2: 99% 94% 98%     Intake/Output Summary (Last 24 hours) at 09/22/13 1156 Last data filed at 09/21/13 2117  Gross per 24 hour  Intake    270 ml  Output   1175 ml  Net   -905 ml   Filed Weights   09/20/13 2203 09/21/13 0615  Weight: 81.647 kg (180 lb) 99.4 kg (219 lb 2.2 oz)     Exam:  General exam: Pleasant middle-aged female lying comfortably in bed Respiratory system: Fair breath sounds bilaterally. Occasional bilateral expiratory rhonchi without crackles. No increased work of breathing. Cardiovascular system: S1 & S2 heard, RRR. No JVD, murmurs, gallops, clicks or pedal edema. Telemetry: Sinus rhythm. Gastrointestinal system: Abdomen is nondistended, soft and nontender. Normal bowel sounds heard. Central nervous system: Alert and oriented. Mild facial asymmetry. Old expressive aphasia.  Extremities: Grade 0 x 5 power right limbs. 5 x 5 power left limbs     Data Reviewed: Basic Metabolic Panel:  Recent Labs Lab 09/21/13 0257  NA 141  K 4.0  CL 107  GLUCOSE 105*  BUN 11  CREATININE 0.90   Liver Function Tests: No results found for this basename: AST, ALT, ALKPHOS, BILITOT, PROT, ALBUMIN,  in the last 168 hours No results found for this basename: LIPASE, AMYLASE,  in the  last 168 hours No results found for this basename: AMMONIA,  in the last 168 hours CBC:  Recent Labs Lab 09/21/13 0248 09/21/13 0257  WBC 8.4  --   NEUTROABS  4.0  --   HGB 13.9 15.6*  HCT 42.7 46.0  MCV 95.5  --   PLT 286  --    Cardiac Enzymes: No results found for this basename: CKTOTAL, CKMB, CKMBINDEX, TROPONINI,  in the last 168 hours BNP (last 3 results)  Recent Labs  08/17/13 1408  PROBNP 25.6   CBG: No results found for this basename: GLUCAP,  in the last 168 hours  Recent Results (from the past 240 hour(s))  MRSA PCR SCREENING     Status: None   Collection Time    09/21/13 10:34 AM      Result Value Ref Range Status   MRSA by PCR NEGATIVE  NEGATIVE Final   Comment:            The GeneXpert MRSA Assay (FDA     approved for NASAL specimens     only), is one component of a     comprehensive MRSA colonization     surveillance program. It is not     intended to diagnose MRSA     infection nor to guide or     monitor treatment for     MRSA infections.      Studies: Dg Chest 2 View  09/20/2013   CLINICAL DATA:  Short of breath  EXAM: CHEST  2 VIEW  COMPARISON:  08/17/2013  FINDINGS: The heart size and mediastinal contours are within normal limits. Both lungs are clear. The visualized skeletal structures are unremarkable.  IMPRESSION: No active cardiopulmonary disease.   Electronically Signed   By: Marlan Palau M.D.   On: 09/20/2013 23:03   Dg Swallowing Func-speech Pathology  09/21/2013   Lenor Derrick, CCC-SLP     09/21/2013  1:40 PM Objective Swallowing Evaluation: Modified Barium Swallowing Study   Patient Details  Name: Andrea Townsend MRN: [id number] Date of Birth: 02/27/1960  Today's Date: 09/21/2013 Time: 8657-8469 SLP Time Calculation (min): 21 min  Past Medical History:  Past Medical History  Diagnosis Date  . Stroke   . Bronchitis    Past Surgical History:  Past Surgical History  Procedure Laterality Date  . Cesarean section    . Abdominal surgery     HPI:  53 year old female known to this SLP, presents with cough and  SOB, and c/o respiratory illness over past 6 months, requiring  several rounds of antibiotics and  steroids.  Pt/husband deny  dysphagia, but upon further questioning, husband states pt does  cough frequently during meals.  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  Receptive  language is intact.  Pt communicates with gestures, facial  expressions, and answers yes/no questions.     Assessment / Plan / Recommendation Clinical Impression  Dysphagia Diagnosis: Mild pharyngeal phase dysphagia Clinical impression: Patient exhibits good swallow function with  only intermittent delay to the level of the pyriforms with thin  liquids.  There was no aspiration or penetration with any  consistency, and no laryngeal residue after the swallow.      Treatment Recommendation  No treatment recommended at this time    Diet Recommendation Regular;Thin liquid   Liquid Administration via: Straw;Cup Medication Administration: Whole meds with liquid Supervision: Patient able to self feed;Intermittent supervision  to cue for compensatory strategies Compensations:  Slow rate;Small sips/bites Postural Changes and/or Swallow Maneuvers: Seated upright 90  degrees    Other  Recommendations Recommended Consults: MBS Oral Care Recommendations: Oral care BID Other Recommendations: Clarify dietary restrictions (Heart  healthy; ? Carb modified)   Follow Up Recommendations  None    Frequency and Duration        Pertinent Vitals/Pain N/A        General HPI: 53 year old female known to this SLP, presents with  cough and SOB, and c/o respiratory illness over past 6 months,  requiring several rounds of antibiotics and steroids.  Pt/husband  deny dysphagia, but upon further questioning, husband states pt  does cough frequently during meals.  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  Receptive  language is intact.  Pt communicates with gestures, facial  expressions, and answers yes/no questions. Type of Study: Modified Barium Swallowing Study Reason for Referral: Objectively  evaluate swallowing function  (r/o silent aspiration) Previous Swallow Assessment: MBS 2008 (unable to access results  from old system).  Pt does have a hx. of dysphagia following her  stroke. Diet Prior to this Study: Regular;Thin liquids Temperature Spikes Noted: No Respiratory Status: Room air History of Recent Intubation: No Behavior/Cognition: Alert;Cooperative;Pleasant mood Oral Cavity - Dentition: Adequate natural dentition Oral Motor / Sensory Function: Within functional limits Self-Feeding Abilities: Able to feed self Patient Positioning: Upright in chair Baseline Vocal Quality: Clear Volitional Cough: Strong Volitional Swallow: Able to elicit Anatomy: Within functional limits Pharyngeal Secretions: Not observed secondary MBS    Reason for Referral Objectively evaluate swallowing function (r/o  silent aspiration)   Oral Phase Oral Preparation/Oral Phase Oral Phase: WFL   Pharyngeal Phase Pharyngeal Phase Pharyngeal Phase: Impaired Pharyngeal - Thin Pharyngeal - Thin Cup: Delayed swallow initiation;Premature  spillage to pyriform sinuses (intermittent)  Cervical Esophageal Phase    GO    Cervical Esophageal Phase Cervical Esophageal Phase: WFL (Distal- Pill was slightly  delayed, but cleared with 2nd swal)         Willis, Lori T 09/21/2013, 1:40 PM         Scheduled Meds: . enoxaparin (LOVENOX) injection  30 mg Subcutaneous Q24H  . ipratropium-albuterol  3 mL Nebulization TID  . levofloxacin  750 mg Oral Daily  . loratadine  10 mg Oral Daily  . omega-3 acid ethyl esters  1 capsule Oral BID  . pantoprazole  40 mg Oral BID  . predniSONE  40 mg Oral Q breakfast   Continuous Infusions:   Principal Problem:   Asthmatic bronchitis Active Problems:   Acute respiratory failure   Late effects of CVA (cerebrovascular accident)   Leukocytosis, unspecified    Time spent: 25 minutes    Andrea Lindsley, MD, FACP, FHM. [facility] Pager [phone]  If 7PM-7AM, please contact  night-coverage [email or web address] Password [email or web address] 09/22/2013, 11:56 AM    LOS: 1 day

## 2013-09-22 NOTE — Progress Notes (Signed)
[facility] no s/s of acute distress noted, VS stable.

## 2013-09-22 NOTE — Progress Notes (Signed)
Received orders to transfer pt, pt and pt's husband aware of transfer. [facility], awaiting call back.

## 2013-09-22 NOTE — Telephone Encounter (Signed)
Message copied by Tommie SamsSILVA, MINDY S on Tue Sep 22, 2013  3:31 PM ------      Message from: Lupita LeashMCQUAID, DOUGLAS B      Created: Tue Sep 22, 2013  3:07 PM       Hello,            Please [place]            Thanks      B ------

## 2013-09-23 LAB — PROCALCITONIN: PROCALCITONIN: 0.47 ng/mL

## 2013-09-23 MED ORDER — PREDNISONE 10 MG PO TABS: ORAL_TABLET | ORAL | Status: DC

## 2013-09-23 MED ORDER — BUDESONIDE-FORMOTEROL FUMARATE 80-4.5 MCG/ACT IN AERO: 2.0000 | INHALATION_SPRAY | Freq: Two times a day (BID) | RESPIRATORY_TRACT | Status: DC

## 2013-09-23 MED ORDER — ENOXAPARIN SODIUM 40 MG/0.4ML ~~LOC~~ SOLN
40.0000 mg | SUBCUTANEOUS | Status: DC
  Administered 2013-09-23: 40 mg via SUBCUTANEOUS
  Filled 2013-09-23: qty 0.4

## 2013-09-23 MED ORDER — PANTOPRAZOLE SODIUM 40 MG PO TBEC: 40.0000 mg | DELAYED_RELEASE_TABLET | Freq: Every day | ORAL | Status: DC

## 2013-09-23 MED ORDER — BUDESONIDE-FORMOTEROL FUMARATE 80-4.5 MCG/ACT IN AERO
2.0000 | INHALATION_SPRAY | Freq: Two times a day (BID) | RESPIRATORY_TRACT | Status: DC
  Filled 2013-09-23: qty 6.9

## 2013-09-23 NOTE — Care Management Note (Signed)
    Page 1 of 1   09/23/2013     3:31:28 PM CARE MANAGEMENT NOTE 09/23/2013  Patient:  Andrea Townsend,Andrea Townsend   Account Number:  [id number]  Date Initiated:  09/23/2013  Documentation initiated by:  Letha CapeAYLOR,Jenisis Harmsen  Subjective/Objective Assessment:   dx copd  admit- lives with spouse.     Action/Plan:   Anticipated DC Date:  09/23/2013   Anticipated DC Plan:  HOME/SELF CARE      DC Planning Services  CM consult      Choice offered to / List presented to:             Status of service:  Completed, signed off Medicare Important Message given?  YES (If response is "NO", the following Medicare IM given date fields will be blank) Date Medicare IM given:  09/23/2013 Medicare IM given by:  Letha CapeAYLOR,Shondrea Steinert Date Additional Medicare IM given:   Additional Medicare IM given by:    Discharge Disposition:  HOME/SELF CARE  Per UR Regulation:  Reviewed for med. necessity/level of care/duration of stay  If discussed at Long Length of Stay Meetings, dates discussed:    Comments:

## 2013-09-23 NOTE — Discharge Summary (Signed)
Andrea Townsend to be D/C'd Home per MD order.  Discussed with the patient and all questions fully answered.    Medication List         albuterol 108 (90 BASE) MCG/ACT inhaler  Commonly known as:  PROVENTIL HFA;VENTOLIN HFA  Inhale 2 puffs into the lungs every 4 (four) hours as needed for wheezing or shortness of breath.     Biotin 1000 MCG tablet  Take 1,000 mcg by mouth 3 (three) times daily.     budesonide-formoterol 80-4.5 MCG/ACT inhaler  Commonly known as:  SYMBICORT  Inhale 2 puffs into the lungs 2 (two) times daily. Use with a spacer device.     multivitamin with minerals Tabs tablet  Take 1 tablet by mouth daily.     omega-3 acid ethyl esters 1 G capsule  Commonly known as:  LOVAZA  Take 1 capsule by mouth 2 (two) times daily.     pantoprazole 40 MG tablet  Commonly known as:  PROTONIX  Take 1 tablet (40 mg total) by mouth daily.     predniSONE 10 MG tablet  Commonly known as:  DELTASONE  Take 3 tabs daily x1 day, then 2 tabs daily x1 day, then 1 tab daily x1 day, then stop        VVS, Skin clean, dry and intact without evidence of skin break down, no evidence of skin tears noted. IV catheter discontinued intact. Site without signs and symptoms of complications. Dressing and pressure applied.  An After Visit Summary was printed and given to the patient.  D/c education completed with patient/family including follow up instructions, medication list, d/c activities limitations if indicated, with other d/c instructions as indicated by MD - patient able to verbalize understanding, all questions fully answered.   Patient instructed to return to ED, call 911, or call MD for any changes in condition.   Patient escorted via WC, and D/C home via private auto.  Beckey DowningFlores, [name] 09/23/2013 2:19 PM

## 2013-09-23 NOTE — Discharge Summary (Signed)
Physician Discharge Summary  [name]:[id number] DOB: 1960-04-27 DOA: 09/21/2013  PCP: Cain Saupe, MD  Admit date: 09/21/2013 Discharge date: 09/23/2013  Time spent: Less than 30 minutes  Recommendations for Outpatient Follow-up:  1. Dr. Cain Saupe, PCP in 1 week. 2. Dr. Chales Salmon, Pulmonology in 2 weeks.  Discharge Diagnoses:  Principal Problem:   Asthmatic bronchitis Active Problems:   Acute respiratory failure   Late effects of CVA (cerebrovascular accident)   Leukocytosis, unspecified   Dysphagia, unspecified(787.20)   Discharge Condition: Improved & Stable  Diet recommendation: Heart healthy diet.  Filed Weights   09/20/13 2203 09/21/13 0615  Weight: 81.647 kg (180 lb) 99.4 kg (219 lb 2.2 oz)    History of present illness:  53 year old female with history of CVA, residual expressive aphasia and right hemiplegia, not on home oxygen, on regular diet, no prior diagnosis of COPD or asthma, former smoker, history of multiple courses of antibiotics and steroids in the last 6 months for dyspnea and wheezing, admitted for worsening dyspnea and wheezing. In ED, Lab work: Unremarkable. Chest x-ray without acute findings   Hospital Course:   1. Acute Asthmatic Bronchitis: she was initially placed on IV solumedrol, IV Abx, bronchodilator nebs & oxygen. Pulmonology was consulted. They switched steroids and Abx to PO. They opined that her presentation was highly suspicious for aspiration given h/o cva with residual deficits. Speech therapy evaluated and did not see any overt aspiration by MBSS and recommended regular diet. IGE and eosinophils were elevated and Pulmonology felt that this could be asthma or allergic rhinitis contributing to post nasal drip and vocal cord dysfunction. She has improved. Discussed with dr. Kendrick Fries who recommends DC on short steroid taper, Symbicort inhaler, prn Albuterol inhaler, daily PPI and no Abx- low index of suspicion for infectious  etiology. OP FU with Pulmonology.  2. Acute Hypoxic Respiratory failure: Secondary to problem #1. Management as above. Improved  3. 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  4. Sinus tachycardia: Secondary to acute respiratory status and bronchodilators. Resolved  5. Mild pharyngeal phase dysphagia: Seen on MBSS, without evidence of aspiration. Regular diet and thin liquids.   Consultations:  Pulmonology  Procedures:  None    Discharge Exam:  Complaints:  Denies SOB or wheezing this morning. Had some wheezing last night. Mild non productive cough.  Filed Vitals:   09/23/13 0201 09/23/13 0644 09/23/13 0935 09/23/13 1035  BP: 156/89 154/96  143/91  Pulse: 99 90  94  Temp: 97.8 F (36.6 C) 97.9 F (36.6 C)  98.6 F (37 C)  TempSrc: Oral Oral  Oral  Resp: 17 16  18   Height:      Weight:      SpO2: 95% 100% 96% 93%    General exam: Pleasant middle-aged female lying comfortably in bed  Respiratory system: clear to auscultation. No increased work of breathing.  Cardiovascular system: S1 & S2 heard, RRR. No JVD, murmurs, gallops, clicks or pedal edema. Gastrointestinal system: Abdomen is nondistended, soft and nontender. Normal bowel sounds heard.  Central nervous system: Alert and oriented. Mild facial asymmetry. Old expressive aphasia.  Extremities: Grade 0 x 5 power right limbs. 5 x 5 power left limbs   Discharge Instructions      Discharge Instructions   Call MD for:  difficulty breathing, headache or visual disturbances    Complete by:  As directed      Diet - low sodium heart healthy    Complete by:  As directed      Increase activity slowly    Complete by:  As directed             Medication List         albuterol 108 (90 BASE) MCG/ACT inhaler  Commonly known as:  PROVENTIL HFA;VENTOLIN HFA  Inhale 2 puffs into the lungs every 4 (four) hours as needed for wheezing or shortness of breath.     Biotin 1000 MCG tablet   Take 1,000 mcg by mouth 3 (three) times daily.     budesonide-formoterol 80-4.5 MCG/ACT inhaler  Commonly known as:  SYMBICORT  Inhale 2 puffs into the lungs 2 (two) times daily. Use with a spacer device.     multivitamin with minerals Tabs tablet  Take 1 tablet by mouth daily.     omega-3 acid ethyl esters 1 G capsule  Commonly known as:  LOVAZA  Take 1 capsule by mouth 2 (two) times daily.     pantoprazole 40 MG tablet  Commonly known as:  PROTONIX  Take 1 tablet (40 mg total) by mouth daily.     predniSONE 10 MG tablet  Commonly known as:  DELTASONE  Take 3 tabs daily x1 day, then 2 tabs daily x1 day, then 1 tab daily x1 day, then stop       Follow-up Information   Follow up with FULP, CAMMIE, MD. Schedule an appointment as soon as possible for a visit in 1 week.   Specialty:  Family Medicine   Contact information:   [address] [place] [address] [phone]       Follow up with Max Fickle, MD. Schedule an appointment as soon as possible for a visit in 2 weeks.   Specialty:  Pulmonary Disease   Contact information:   [address] [place] [address] [phone]        The results of significant diagnostics from this hospitalization (including imaging, microbiology, ancillary and laboratory) are listed below for reference.    Significant Diagnostic Studies: Dg Chest 2 View  09/20/2013   CLINICAL DATA:  Short of breath  EXAM: CHEST  2 VIEW  COMPARISON:  08/17/2013  FINDINGS: The heart size and mediastinal contours are within normal limits. Both lungs are clear. The visualized skeletal structures are unremarkable.  IMPRESSION: No active cardiopulmonary disease.   Electronically Signed   By: Marlan Palau M.D.   On: 09/20/2013 23:03   Dg Swallowing Func-speech Pathology  09/21/2013   Lenor Derrick, CCC-SLP     09/21/2013  1:40 PM Objective Swallowing Evaluation: Modified Barium Swallowing Study   Patient Details  Name: Andrea Townsend MRN:  [id number] Date of Birth: 12/28/1960  Today's Date: 09/21/2013 Time: 0347-4259 SLP Time Calculation (min): 21 min  Past Medical History:  Past Medical History  Diagnosis Date  . Stroke   . Bronchitis    Past Surgical History:  Past Surgical History  Procedure Laterality Date  . Cesarean section    . Abdominal surgery     HPI:  53 year old female known to this SLP, presents with cough and  SOB, and c/o respiratory illness over past 6 months, requiring  several rounds of antibiotics and steroids.  Pt/husband deny  dysphagia, but upon further questioning, husband states pt does  cough frequently during meals.  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  Receptive  language is intact.  Pt communicates with gestures, facial  expressions, and  answers yes/no questions.     Assessment / Plan / Recommendation Clinical Impression  Dysphagia Diagnosis: Mild pharyngeal phase dysphagia Clinical impression: Patient exhibits good swallow function with  only intermittent delay to the level of the pyriforms with thin  liquids.  There was no aspiration or penetration with any  consistency, and no laryngeal residue after the swallow.      Treatment Recommendation  No treatment recommended at this time    Diet Recommendation Regular;Thin liquid   Liquid Administration via: Straw;Cup Medication Administration: Whole meds with liquid Supervision: Patient able to self feed;Intermittent supervision  to cue for compensatory strategies Compensations: Slow rate;Small sips/bites Postural Changes and/or Swallow Maneuvers: Seated upright 90  degrees    Other  Recommendations Recommended Consults: MBS Oral Care Recommendations: Oral care BID Other Recommendations: Clarify dietary restrictions (Heart  healthy; ? Carb modified)   Follow Up Recommendations  None    Frequency and Duration        Pertinent Vitals/Pain N/A        General HPI: 53 year old female known to this SLP, presents with  cough and SOB, and c/o  respiratory illness over past 6 months,  requiring several rounds of antibiotics and steroids.  Pt/husband  deny dysphagia, but upon further questioning, husband states pt  does cough frequently during meals.  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  Receptive  language is intact.  Pt communicates with gestures, facial  expressions, and answers yes/no questions. Type of Study: Modified Barium Swallowing Study Reason for Referral: Objectively evaluate swallowing function  (r/o silent aspiration) Previous Swallow Assessment: MBS 2008 (unable to access results  from old system).  Pt does have a hx. of dysphagia following her  stroke. Diet Prior to this Study: Regular;Thin liquids Temperature Spikes Noted: No Respiratory Status: Room air History of Recent Intubation: No Behavior/Cognition: Alert;Cooperative;Pleasant mood Oral Cavity - Dentition: Adequate natural dentition Oral Motor / Sensory Function: Within functional limits Self-Feeding Abilities: Able to feed self Patient Positioning: Upright in chair Baseline Vocal Quality: Clear Volitional Cough: Strong Volitional Swallow: Able to elicit Anatomy: Within functional limits Pharyngeal Secretions: Not observed secondary MBS    Reason for Referral Objectively evaluate swallowing function (r/o  silent aspiration)   Oral Phase Oral Preparation/Oral Phase Oral Phase: WFL   Pharyngeal Phase Pharyngeal Phase Pharyngeal Phase: Impaired Pharyngeal - Thin Pharyngeal - Thin Cup: Delayed swallow initiation;Premature  spillage to pyriform sinuses (intermittent)  Cervical Esophageal Phase    GO    Cervical Esophageal Phase Cervical Esophageal Phase: WFL (Distal- Pill was slightly  delayed, but cleared with 2nd swal)         Maryjo Rochester T 09/21/2013, 1:40 PM     Microbiology: Recent Results (from the past 240 hour(s))  MRSA PCR SCREENING     Status: None   Collection Time    09/21/13 10:34 AM      Result Value Ref Range Status   MRSA  by PCR NEGATIVE  NEGATIVE Final   Comment:            The GeneXpert MRSA Assay (FDA     approved for NASAL specimens     only), is one component of a     comprehensive MRSA colonization     surveillance program. It is not     intended to diagnose MRSA     infection nor to guide or     monitor treatment for     MRSA infections.  Labs: Basic Metabolic Panel:  Recent Labs Lab 09/21/13 0257  NA 141  K 4.0  CL 107  GLUCOSE 105*  BUN 11  CREATININE 0.90   Liver Function Tests: No results found for this basename: AST, ALT, ALKPHOS, BILITOT, PROT, ALBUMIN,  in the last 168 hours No results found for this basename: LIPASE, AMYLASE,  in the last 168 hours No results found for this basename: AMMONIA,  in the last 168 hours CBC:  Recent Labs Lab 09/21/13 0248 09/21/13 0257  WBC 8.4  --   NEUTROABS 4.0  --   HGB 13.9 15.6*  HCT 42.7 46.0  MCV 95.5  --   PLT 286  --    Cardiac Enzymes: No results found for this basename: CKTOTAL, CKMB, CKMBINDEX, TROPONINI,  in the last 168 hours BNP: BNP (last 3 results)  Recent Labs  08/17/13 1408  PROBNP 25.6   CBG: No results found for this basename: GLUCAP,  in the last 168 hours  Additional labs: 1. IgE: 295.4 2. 2 D Echo: Study Conclusions  - Left ventricle: The cavity size was normal. Systolic function was vigorous. The estimated ejection fraction was in the range of 65% to 70%. Wall motion was normal; there were no regional wall motion abnormalities. Doppler parameters are consistent with abnormal left ventricular relaxation (grade 1 diastolic dysfunction).     Signed:  Marcellus ScottHONGALGI,Tywanna Seifer, MD, FACP, FHM. [facility] Pager [phone]  If 7PM-7AM, please contact night-coverage [email or web address] Password [email or web address] 09/23/2013, 12:31 PM

## 2013-09-23 NOTE — Progress Notes (Signed)
ANTICOAGULATION CONSULT NOTE - Initial Consult  Pharmacy Consult for lovenox Indication: VTE prophylaxis  No Known Allergies  Patient Measurements: Height: 5\' 4"  (162.6 cm) Weight: 219 lb 2.2 oz (99.4 kg) IBW/kg (Calculated) : 54.7   Vital Signs: Temp: 97.9 F (36.6 C) (08/05 0644) Temp src: Oral (08/05 0644) BP: 154/96 mmHg (08/05 0644) Pulse Rate: 90 (08/05 0644)  Labs:  Recent Labs  09/21/13 0248 09/21/13 0257  HGB 13.9 15.6*  HCT 42.7 46.0  PLT 286  --   CREATININE  --  0.90    Estimated Creatinine Clearance: 82.9 ml/min (by C-G formula based on Cr of 0.9).   Medical History: Past Medical History  Diagnosis Date  . Stroke   . Bronchitis    Assessment: Patient's a 53 y.o F on lovenox 30mg  SQ q24h for VTE prophylaxis.  Scr 0.47 (est crcl~83).     Plan:  1) will adjust lovenox dose to 40mg  SQ daily d/t weight and renal function 2)  Pharmacy will sign off  Lindsee Labarre P 09/23/2013,8:40 AM

## 2013-09-23 NOTE — Telephone Encounter (Signed)
lmomtcb x 2  

## 2013-09-24 NOTE — Telephone Encounter (Signed)
Spoke with the pt's spouse and scheduled appt with BQ for HFU on 10/08/13 at 3:30 pm  Directions to office provided  Nothing further needed

## 2013-10-08 ENCOUNTER — Encounter: Payer: Self-pay | Admitting: Pulmonary Disease

## 2013-10-08 ENCOUNTER — Encounter (INDEPENDENT_AMBULATORY_CARE_PROVIDER_SITE_OTHER): Payer: Self-pay

## 2013-10-08 ENCOUNTER — Ambulatory Visit (INDEPENDENT_AMBULATORY_CARE_PROVIDER_SITE_OTHER): Payer: Medicare Other | Admitting: Pulmonary Disease

## 2013-10-08 VITALS — BP 124/70 | HR 70 | Wt 223.0 lb

## 2013-10-08 DIAGNOSIS — R05 Cough: Secondary | ICD-10-CM | POA: Diagnosis not present

## 2013-10-08 DIAGNOSIS — R062 Wheezing: Secondary | ICD-10-CM

## 2013-10-08 DIAGNOSIS — R059 Cough, unspecified: Secondary | ICD-10-CM

## 2013-10-08 NOTE — Assessment & Plan Note (Signed)
This has nearly completely resolved. It was due to acid reflux and postnasal drip from allergic rhinitis.  Plan: -Continue PPI -Acid reflux lifestyle modification information provided -Nasacort for postnasal drip -Zyrtec on days when postnasal drip is worse

## 2013-10-08 NOTE — Assessment & Plan Note (Signed)
She has had complete resolution of this problem. She has not been using the Symbicort inhaler and she has no chest tightness, wheezing, or shortness of breath. So I think that asthma is very unlikely. Because of the severity of the wheezing she had during her hospitalization we will get a lung function test to rule out any sort of underlying lung disease. However, I think the most likely etiology is postnasal drip leading to vocal cord related irritation. This is a common cause of cough and wheezing.

## 2013-10-08 NOTE — Progress Notes (Signed)
Subjective:    Patient ID: Andrea Townsend, female    DOB: 1960-10-28, 53 y.o.   MRN: [id number]  Synopsis: This is a very pleasant 53 year old female who had a stroke leading to aphasia and right-sided hemiparalysis. She was hospitalized in 2015 for wheezing with an associated elevated eosinophil count and IgE. She was discharged on prednisone.  HPI 10/08/2013> Andrea Townsend has been doing very well since hospital discharge. She has had near complete resolution of her cough. She does not have chest tightness, wheezing, or shortness of breath. She has been as active as she is capable with her limitations from stroke and she has not been experiencing shortness of breath. She has not been using the Symbicort inhaler. She is no longer has to use the albuterol inhaler either. She continues to have a slight dry cough which again has improved significantly. She continues to take the acid reflux medication. She does have a little but of postnasal drip. She's currently not taking medication for that.  Past Medical History  Diagnosis Date  . Stroke   . Bronchitis       Review of Systems  Constitutional: Negative for fever, chills and fatigue.  HENT: Negative for nosebleeds, postnasal drip and rhinorrhea.   Respiratory: Negative for cough, shortness of breath and wheezing.   Cardiovascular: Negative for chest pain, palpitations and leg swelling.       Objective:   Physical Exam Filed Vitals:   10/08/13 1524  BP: 124/70  Pulse: 70  Weight: 223 lb (101.152 kg)  SpO2: 98%   Gen; well appearing HEENT: NCAT, EOMi PULM: CTA B CV: RRR, no mgr AB: BS+, soft, nontender Ext: warm, no edema Neuro: aphasia > only says yes, paralysis right side       Assessment & Plan:   Cough This has nearly completely resolved. It was due to acid reflux and postnasal drip from allergic rhinitis.  Plan: -Continue PPI -Acid reflux lifestyle modification information provided -Nasacort for postnasal  drip -Zyrtec on days when postnasal drip is worse  Wheezing She has had complete resolution of this problem. She has not been using the Symbicort inhaler and she has no chest tightness, wheezing, or shortness of breath. So I think that asthma is very unlikely. Because of the severity of the wheezing she had during her hospitalization we will get a lung function test to rule out any sort of underlying lung disease. However, I think the most likely etiology is postnasal drip leading to vocal cord related irritation. This is a common cause of cough and wheezing.   Updated Medication List Outpatient Encounter Prescriptions as of 10/08/2013  Medication Sig  . albuterol (PROVENTIL HFA;VENTOLIN HFA) 108 (90 BASE) MCG/ACT inhaler Inhale 2 puffs into the lungs every 4 (four) hours as needed for wheezing or shortness of breath.  . Biotin 1000 MCG tablet Take 1,000 mcg by mouth 3 (three) times daily.  . budesonide-formoterol (SYMBICORT) 80-4.5 MCG/ACT inhaler Inhale 2 puffs into the lungs 2 (two) times daily. Use with a spacer device.  . Multiple Vitamin (MULTIVITAMIN WITH MINERALS) TABS tablet Take 1 tablet by mouth daily.  Marland Kitchen. omega-3 acid ethyl esters (LOVAZA) 1 G capsule Take 1 capsule by mouth 2 (two) times daily.  . pantoprazole (PROTONIX) 40 MG tablet Take 1 tablet (40 mg total) by mouth daily.  . [DISCONTINUED] predniSONE (DELTASONE) 10 MG tablet Take 3 tabs daily x1 day, then 2 tabs daily x1 day, then 1 tab daily x1 day, then stop

## 2013-10-08 NOTE — Patient Instructions (Signed)
For the cough: Keep taking the pantoprazole daily and follow the GERD lifestyle sheet we gave you   For sinus congestion (related to cough): Use Nasacort two puffs in each nostril once per day.  Remember that the Nasacort can take 1-2 weeks to work after regular use. Use generic zyrtec (cetirizine) every day.  If this doesn't help, then stop taking it and use chlorpheniramine-phenylephrine combination tablets.  We will arrange a lung function test and call you with the results  If you have to use your inhaler again, let us know  Otherwise we will only see you back if needed

## 2013-12-25 DIAGNOSIS — J45901 Unspecified asthma with (acute) exacerbation: Secondary | ICD-10-CM | POA: Diagnosis not present

## 2013-12-25 DIAGNOSIS — J159 Unspecified bacterial pneumonia: Secondary | ICD-10-CM | POA: Diagnosis not present

## 2014-02-12 DIAGNOSIS — J209 Acute bronchitis, unspecified: Secondary | ICD-10-CM | POA: Diagnosis not present

## 2014-02-12 DIAGNOSIS — J45901 Unspecified asthma with (acute) exacerbation: Secondary | ICD-10-CM | POA: Diagnosis not present

## 2014-03-05 DIAGNOSIS — J45901 Unspecified asthma with (acute) exacerbation: Secondary | ICD-10-CM | POA: Diagnosis not present

## 2014-03-11 ENCOUNTER — Encounter: Payer: Self-pay | Admitting: Pulmonary Disease

## 2014-03-11 ENCOUNTER — Ambulatory Visit (INDEPENDENT_AMBULATORY_CARE_PROVIDER_SITE_OTHER): Payer: Medicare Other | Admitting: Pulmonary Disease

## 2014-03-11 VITALS — BP 148/78 | HR 88 | Resp 16 | Wt 220.0 lb

## 2014-03-11 DIAGNOSIS — J4531 Mild persistent asthma with (acute) exacerbation: Secondary | ICD-10-CM

## 2014-03-11 DIAGNOSIS — R062 Wheezing: Secondary | ICD-10-CM

## 2014-03-11 MED ORDER — E-Z SPACER DEVI: Status: DC

## 2014-03-11 MED ORDER — BUDESONIDE-FORMOTEROL FUMARATE 80-4.5 MCG/ACT IN AERO: 2.0000 | INHALATION_SPRAY | Freq: Two times a day (BID) | RESPIRATORY_TRACT | Status: DC

## 2014-03-11 MED ORDER — AEROCHAMBER MV MISC: Status: AC

## 2014-03-11 NOTE — Patient Instructions (Signed)
Take symbicort with a spacer 2 puffs twice a day no matter how you feel, rinse out your mouth afterwards Come back in 2 weeks for blood work We will see you back in 4 weeks or sooner if neded

## 2014-03-11 NOTE — Progress Notes (Signed)
Subjective:    Patient ID: Andrea Townsend, female    DOB: 1960-02-27, 54 y.o.   MRN: [id number]  Synopsis: This is a very pleasant 54 year old female who had a stroke leading to aphasia and right-sided hemiparalysis. She was hospitalized in 2015 for wheezing with an associated elevated eosinophil count and IgE. She was discharged on prednisone.  HPI Chief Complaint  Patient presents with  . Follow-up    pt has been to urgent care 3 times since last visit for sob.  Pt currently c/o sob with any exertion, wheezing, prod cough with white mucus.    Since the last visit Andrea Townsend has had to go to urgent care on 3 separate occasions for recurrent wheezing, chest tightness and shortness of breath. She says that there is no particular trigger to these episodes. Specifically, she doesn't think she has had a respiratory infection of any kind since she saw me last. There has not been any major change to the living conditions. Today we spent a long time talking about their environment and it sounds like there may be some mold damage in one of their bathrooms. Otherwise they do not have any new animals in the house and there is no cockroaches that they're aware of and their mattresses are new. They do have wall-to-wall carpet. She denies any sort of sinus symptoms or congestion. She has not been using Symbicort because she said she didn't think she needed it after the last visit and she says it is quite expensive. Of note, her husband provides the entire history because of her nonverbal status.  Past Medical History  Diagnosis Date  . Stroke   . Bronchitis       Review of Systems  Constitutional: Negative for fever, chills and fatigue.  HENT: Negative for nosebleeds, postnasal drip and rhinorrhea.   Respiratory: Positive for cough, shortness of breath and wheezing.   Cardiovascular: Negative for chest pain, palpitations and leg swelling.       Objective:   Physical Exam Filed Vitals:   03/11/14 1342  BP: 148/78  Pulse: 88  Resp: 16  Weight: 220 lb (99.791 kg)  SpO2: 96%  RA  Gen; well appearing but in wheelchair HEENT: NCAT, EOMi PULM: mild expiratory wheeze bilaterally, good air movement CV: RRR, no mgr AB: BS+, soft, nontender Ext: warm, no edema Neuro: aphasia > only says yes, paralysis right side  Urgent care and recent X-ray results not available, will request     Assessment & Plan:   Wheezing This has become a recurrent, serious problem for Andrea Townsend again because she has had 3 exacerbations of it sounds like asthma since the last visit with me. She never went for the lung function test I think because initially she was doing quite well. However, she does have some wheezing today on physical exam and it does not sound like it's coming from her vocal cords. Further, she has been to urgent care on 3 separate occasions for prednisone since the last visit.  So now I am inclined to think that she may have asthma. I would like to get lung function testing to try to assess further. Today we spent a long time talking about changes in her environment to see if there is anything which could be causing this. It sounds like there is some mold and mildew damage in the bathroom.  Plan: -Resume Symbicort, I explained to them the importance of this today -Use Symbicort with a spacer -Complete prednisone taper -Full pulmonary  function testing -CBC with differential, IgE, serum allergy testing 2 weeks after completing the steroid.     Updated Medication List Outpatient Encounter Prescriptions as of 03/11/2014  Medication Sig  . albuterol (PROVENTIL HFA;VENTOLIN HFA) 108 (90 BASE) MCG/ACT inhaler Inhale 2 puffs into the lungs every 4 (four) hours as needed for wheezing or shortness of breath.  . Biotin 1000 MCG tablet Take 1,000 mcg by mouth 3 (three) times daily.  . budesonide-formoterol (SYMBICORT) 80-4.5 MCG/ACT inhaler Inhale 2 puffs into the lungs 2 (two) times daily.  Use with a spacer device.  . Multiple Vitamin (MULTIVITAMIN WITH MINERALS) TABS tablet Take 1 tablet by mouth daily.  Marland Kitchen. omega-3 acid ethyl esters (LOVAZA) 1 G capsule Take 1 capsule by mouth 2 (two) times daily.  . pantoprazole (PROTONIX) 40 MG tablet Take 1 tablet (40 mg total) by mouth daily.  . predniSONE (DELTASONE) 20 MG tablet Take 20 mg by mouth 3 (three) times daily.  . [DISCONTINUED] budesonide-formoterol (SYMBICORT) 80-4.5 MCG/ACT inhaler Inhale 2 puffs into the lungs 2 (two) times daily. Use with a spacer device.  Marland Kitchen. Spacer/Aero-Holding Chambers (AEROCHAMBER MV) inhaler Use as instructed  . Spacer/Aero-Holding Chambers (E-Z SPACER) inhaler Use as instructed

## 2014-03-11 NOTE — Assessment & Plan Note (Signed)
This has become a recurrent, serious problem for Andrea Townsend again because she has had 3 exacerbations of it sounds like asthma since the last visit with me. She never went for the lung function test I think because initially she was doing quite well. However, she does have some wheezing today on physical exam and it does not sound like it's coming from her vocal cords. Further, she has been to urgent care on 3 separate occasions for prednisone since the last visit.  So now I am inclined to think that she may have asthma. I would like to get lung function testing to try to assess further. Today we spent a long time talking about changes in her environment to see if there is anything which could be causing this. It sounds like there is some mold and mildew damage in the bathroom.  Plan: -Resume Symbicort, I explained to them the importance of this today -Use Symbicort with a spacer -Complete prednisone taper -Full pulmonary function testing -CBC with differential, IgE, serum allergy testing 2 weeks after completing the steroid.

## 2014-04-13 ENCOUNTER — Other Ambulatory Visit (INDEPENDENT_AMBULATORY_CARE_PROVIDER_SITE_OTHER): Payer: Medicare Other

## 2014-04-13 DIAGNOSIS — J4531 Mild persistent asthma with (acute) exacerbation: Secondary | ICD-10-CM

## 2014-04-13 DIAGNOSIS — J45901 Unspecified asthma with (acute) exacerbation: Secondary | ICD-10-CM | POA: Diagnosis not present

## 2014-04-13 LAB — CBC WITH DIFFERENTIAL/PLATELET
BASOS PCT: 0.3 % (ref 0.0–3.0)
Basophils Absolute: 0 10*3/uL (ref 0.0–0.1)
Eosinophils Absolute: 0.5 10*3/uL (ref 0.0–0.7)
Eosinophils Relative: 7.3 % — ABNORMAL HIGH (ref 0.0–5.0)
HEMATOCRIT: 43.3 % (ref 36.0–46.0)
Hemoglobin: 14.3 g/dL (ref 12.0–15.0)
Lymphocytes Relative: 30.9 % (ref 12.0–46.0)
Lymphs Abs: 2.2 10*3/uL (ref 0.7–4.0)
MCHC: 33.1 g/dL (ref 30.0–36.0)
MCV: 93.2 fl (ref 78.0–100.0)
MONO ABS: 0.3 10*3/uL (ref 0.1–1.0)
Monocytes Relative: 3.6 % (ref 3.0–12.0)
NEUTROS PCT: 57.9 % (ref 43.0–77.0)
Neutro Abs: 4.1 10*3/uL (ref 1.4–7.7)
PLATELETS: 323 10*3/uL (ref 150.0–400.0)
RBC: 4.64 Mil/uL (ref 3.87–5.11)
RDW: 15.1 % (ref 11.5–15.5)
WBC: 7.2 10*3/uL (ref 4.0–10.5)

## 2014-04-14 LAB — ALLERGY FULL PROFILE
Allergen, D pternoyssinus,d7: <0.1 kU/L
Alternaria Alternata: <0.1 kU/L
Aspergillus fumigatus, m3: <0.1 kU/L
Bahia Grass: <0.1 kU/L
Bermuda Grass: <0.1 kU/L
CANDIDA ALBICANS: 0.31 kU/L — AB
Cat Dander: <0.1 kU/L
Common Ragweed: <0.1 kU/L
Curvularia lunata: <0.1 kU/L
D. farinae: <0.1 kU/L
Elm IgE: <0.1 kU/L
G005 Rye, Perennial: <0.1 kU/L
G009 Red Top: <0.1 kU/L
House Dust Hollister: <0.1 kU/L
IGE (IMMUNOGLOBULIN E), SERUM: 560 kU/L — AB (ref <115)
Lamb's Quarters: <0.1 kU/L
Plantain: <0.1 kU/L
Sycamore Tree: <0.1 kU/L

## 2014-04-19 NOTE — Progress Notes (Signed)
Quick Note:  lmtcb X1 ______ 

## 2014-04-26 ENCOUNTER — Ambulatory Visit (INDEPENDENT_AMBULATORY_CARE_PROVIDER_SITE_OTHER): Payer: Medicare Other | Admitting: Pulmonary Disease

## 2014-04-26 DIAGNOSIS — R062 Wheezing: Secondary | ICD-10-CM

## 2014-04-26 NOTE — Progress Notes (Deleted)
   Subjective:    Patient ID: Andrea Townsend, female    DOB: 09-22-1960, 54 y.o.   MRN: [id number]  HPI    Review of Systems     Objective:   Physical Exam        Assessment & Plan:

## 2014-04-27 NOTE — Progress Notes (Signed)
Patient showed up over 35 minutes late for her appointment after 5PM.  We rescheduled her appointment to see Tammy Parrett later in the week.

## 2014-04-28 ENCOUNTER — Ambulatory Visit: Payer: Medicare Other | Admitting: Internal Medicine

## 2014-04-29 ENCOUNTER — Encounter: Payer: Self-pay | Admitting: Pulmonary Disease

## 2014-04-29 ENCOUNTER — Ambulatory Visit (INDEPENDENT_AMBULATORY_CARE_PROVIDER_SITE_OTHER): Payer: Medicare Other | Admitting: Pulmonary Disease

## 2014-04-29 VITALS — BP 124/90 | HR 76 | Temp 97.9°F | Ht 64.0 in | Wt 215.0 lb

## 2014-04-29 DIAGNOSIS — J383 Other diseases of vocal cords: Secondary | ICD-10-CM | POA: Diagnosis not present

## 2014-04-29 DIAGNOSIS — R0600 Dyspnea, unspecified: Secondary | ICD-10-CM | POA: Diagnosis not present

## 2014-04-29 MED ORDER — PREDNISONE 10 MG PO TABS: ORAL_TABLET | ORAL | Status: DC

## 2014-04-29 NOTE — Progress Notes (Signed)
   Subjective:    Patient ID: Andrea Townsend, female    DOB: 1960/07/06, 54 y.o.   MRN: [id number]  HPI Patient comes in today for an acute sick visit. She is in the process of being evaluated by Dr. Kendrick FriesMcQuaid for dyspnea, but it is unclear whether this is related to asthma, or upper airway dysfunction/VCD. She is currently on Symbicort, and has had Rast testing that showed an elevated IgE, but no significant allergens seen. She also has mildly elevated eosinophils on her CBC with differential. She comes in today where she is continuing to have dyspnea at rest or with exertion, does not feel that it is any worse than one week ago. She is having a little more cough and congestion with only white mucus production.  She has not had any fevers, chills, or sweats. Her oxygen saturations are excellent today, and she has no respiratory distress or accessory muscle use.   Review of Systems  Constitutional: Negative for fever and unexpected weight change.  HENT: Negative for congestion, dental problem, ear pain, nosebleeds, postnasal drip, rhinorrhea, sinus pressure, sneezing, sore throat and trouble swallowing.   Eyes: Negative for redness and itching.  Respiratory: Positive for cough, chest tightness, shortness of breath and wheezing.   Cardiovascular: Negative for palpitations and leg swelling.  Gastrointestinal: Negative for nausea and vomiting.  Genitourinary: Negative for dysuria.  Musculoskeletal: Negative for joint swelling.  Skin: Negative for rash.  Neurological: Negative for headaches.  Hematological: Does not bruise/bleed easily.  Psychiatric/Behavioral: Negative for dysphoric mood. The patient is not nervous/anxious.        Objective:   Physical Exam Overweight female in no acute distress Nose without purulence or discharge noted Neck without lymphadenopathy or thyromegaly Chest with adequate airflow, no true wheezing, loud upper airway pseudo wheezing transmitted to her lower  airways. Cardiac exam with regular rate and rhythm Lower extremities with mild edema, no cyanosis Alert and oriented, moves all 4 extremities.       Assessment & Plan:

## 2014-04-29 NOTE — Patient Instructions (Signed)
Will get you back on symbicort 160, 2 puffs am and pm WITH A SPACER everyday whether you think you need or not. Will treat with an 8 day course of prednisone to see if this helps your breathing. Will refer you to an ENT doctor to make sure that your vocal cords are working properly. Would like you to followup with Andrea Townsend in 1-2 weeks.

## 2014-04-29 NOTE — Assessment & Plan Note (Signed)
This is a very difficult case to figure out how much of her issue is upper airway versus lower airway. We tried to do spirometry today, and she was unable to do the maneuver or seal around the mouthpiece. Her family member now tells me that they were unable to afford Symbicort, and they have not been using this. They also tell me that she does typically get better with prednisone. The patient does have an elevated IgE level and eosinophil level, and therefore it is possible that she may have asthma. Unfortunately, we are not going to be able to document this. I will go ahead and put her back on symbicort, and have given her samples along with a spacer. Will also treat her with a short course of prednisone, and have her follow-up with her primary pulmonologist in the next 1-2 weeks.

## 2014-04-29 NOTE — Addendum Note (Signed)
Addended by: Abigail MiyamotoPHELPS, DENISE D on: 04/29/2014 12:36 PM   Modules accepted: Orders

## 2014-04-29 NOTE — Assessment & Plan Note (Signed)
The patient is having classic upper airway pseudo wheezing that is clearly coming from her laryngeal area. I wonder if she could have vocal cord dysfunction associated with her prior stroke. She tells me that she has not had an upper airway evaluation, and I will therefore refer her to otolaryngology for evaluation.

## 2014-05-13 ENCOUNTER — Ambulatory Visit: Payer: Medicare Other | Admitting: Pulmonary Disease

## 2014-06-14 ENCOUNTER — Ambulatory Visit: Payer: Medicare Other | Admitting: Pulmonary Disease

## 2014-08-02 DIAGNOSIS — I69859 Hemiplegia and hemiparesis following other cerebrovascular disease affecting unspecified side: Secondary | ICD-10-CM | POA: Diagnosis not present

## 2014-08-02 DIAGNOSIS — S99921A Unspecified injury of right foot, initial encounter: Secondary | ICD-10-CM | POA: Diagnosis not present

## 2014-08-09 ENCOUNTER — Encounter: Payer: Self-pay | Admitting: Occupational Therapy

## 2014-08-09 ENCOUNTER — Ambulatory Visit: Payer: Medicare Other | Admitting: Occupational Therapy

## 2014-08-09 ENCOUNTER — Ambulatory Visit: Payer: Medicare Other | Attending: Family Medicine | Admitting: Occupational Therapy

## 2014-08-09 DIAGNOSIS — M79641 Pain in right hand: Secondary | ICD-10-CM | POA: Diagnosis not present

## 2014-08-09 DIAGNOSIS — Z7409 Other reduced mobility: Secondary | ICD-10-CM | POA: Diagnosis not present

## 2014-08-09 DIAGNOSIS — M25511 Pain in right shoulder: Secondary | ICD-10-CM | POA: Diagnosis not present

## 2014-08-09 DIAGNOSIS — G8111 Spastic hemiplegia affecting right dominant side: Secondary | ICD-10-CM | POA: Diagnosis not present

## 2014-08-09 NOTE — Therapy (Signed)
[facility] [address] [place], [place], [address] Phone: [phone]   Fax:  [phone]  Occupational Therapy Evaluation  Patient Details  Name: Andrea Townsend MRN: [id number] Date of Birth: 07-05-1960 Referring Provider:  Cain Saupe, MD  Encounter Date: 08/09/2014      OT End of Session - 08/09/14 1125    Visit Number 1   Number of Visits 16   Date for OT Re-Evaluation 10/04/14   OT Start Time 0845   OT Stop Time 0929   OT Time Calculation (min) 44 min   Activity Tolerance Patient tolerated treatment well      Past Medical History  Diagnosis Date  . Stroke   . Bronchitis     Past Surgical History  Procedure Laterality Date  . Cesarean section    . Abdominal surgery      There were no vitals filed for this visit.  Visit Diagnosis:  Spastic hemiplegia affecting right dominant side - Plan: Ot plan of care cert/re-cert  Pain in joint involving shoulder region, right - Plan: Ot plan of care cert/re-cert  Pain in joint, hand, right - Plan: Ot plan of care cert/re-cert  Impaired functional mobility and activity tolerance - Plan: Ot plan of care cert/re-cert      Subjective Assessment - 08/09/14 0848    Subjective  "yes"  Pt is aphasic however appears to understand.     Patient is accompained by: Family member  husband   Pertinent History see epic snapshot. Pt with LCVA in 2008   Patient Stated Goals Pt unable but points to arm; husband states main reason for referral is for new splint.           [facility] OT Assessment - 08/09/14 0001    Assessment   Diagnosis L CVA   Onset Date --  2008   Prior Therapy PT, OT and ST inpatient and outpatient.   Precautions   Precautions None   Restrictions   Weight Bearing Restrictions No   Balance Screen   Has the patient fallen in the past 6 months Yes   How many times? 2   Home  Environment   Family/patient expects to be discharged to: Private  residence   Living Arrangements Spouse/significant other   Available Help at Discharge Available PRN/intermittently   Type of Home House   Home Layout One level   Bathroom Shower/Tub Other (comment)  shower stall   Additional Comments Pt uses shower seat in bath   Prior Function   Level of Independence Independent  prior to stroke   ADL   Eating/Feeding Minimal assistance  for cutting   Grooming --  min a for hair, pt uses L hand only   Upper Body Bathing Modified independent  husband washes L side   Lower Body Bathing Moderate assistance  husband helps with bottom and left leg/foot   Upper Body Dressing Independent   Lower Body Dressing Minimal assistance  socks and shoes   Toilet Tranfer Modified independent   Toileting - Clothing Manipulation Minimal assistance  husband occassional has to assist to hike pants on L side   Toileting -  Hygiene Modified Independent   Tub/Shower Transfer Supervision/safety   IADL   Shopping Assistance for transportation   Light Housekeeping Maintains house alone or with occasional assistance   Meal Prep Needs to have meals prepared and served  would like to cook more/husband fearful of falls   Community Mobility Relies on family or  friends for transportation   Medication Management Is responsible for taking medication in correct dosages at correct time  husband opens pill bottles then pt can manage   Financial Management --  N/A   Mobility   Mobility Status History of falls   Written Expression   Dominant Hand Right   Handwriting --  pt able to print name; pt is aphasic   Vision - History   Baseline Vision Wears glasses only for reading   Vision Assessment   Eye Alignment Within Functional Limits   Vision Assessment Vision not tested   Ocular Range of Motion Within Functional Limits   Comment Pt states "yes" when asked if vision is blurry   Cognition   Attention Sustained   Memory Impaired  to be further assessed pt unable to  state details   Sensation   Light Touch Impaired by gross assessment   Hot/Cold Impaired by gross assessment   Proprioception Not tested  due to language    Coordination   9 Hole Peg Test --  unable   Box and Blocks --  unable   Tone   Assessment Location Right Upper Extremity   ROM / Strength   AROM / PROM / Strength PROM  pt has no AROM;    PROM   Overall PROM  Deficits   Overall PROM Comments Pt limited in shoulder flexion to 70* , abduction to 75*, no supination,no ER, limited in finger extension,   Hand Function   Comment Pt has no active movement in R hand   RUE Tone   RUE Tone Severe                           OT Short Term Goals - 08/09/14 1113    OT SHORT TERM GOAL #1   Title Pt and husband will be mod I with HEP - 09/06/2014   Status New   OT SHORT TERM GOAL #2   Title Pt will be mod I with wear and care of resting hand splint    Status New   OT SHORT TERM GOAL #3   Title Pt and husband will demonstrate ability to employ at least two tone reduction techniques   OT SHORT TERM GOAL #4   Title Pt will be supervision for simple meal prep at ambulatory level   Status New   OT SHORT TERM GOAL #5   Title Pt will demonstrate understanding of strategies for cutting and tying shoes with AE   Status New   Additional Short Term Goals   Additional Short Term Goals Yes   OT SHORT TERM GOAL #6   Title Pt will tolerate 80* of shoulder flexion without pain to aide in ease of ADL's   Status New           OT Long Term Goals - 08/09/14 1117    OT LONG TERM GOAL #1   Title Pt and husband will be mod I with upgraded HEP prn - 10/04/2014   Status New   OT LONG TERM GOAL #2   Title Pt and husband will demonstrate understanding of medical options for tone management in LUE   Status New   OT LONG TERM GOAL #3   Title Pt will be mod I for toileting   Status New   OT LONG TERM GOAL #4   Title Pt will be mod I for LB bathing   Status New   OT LONG TERM  GOAL #5   Title Pt will be mod I for simple hot meal prep   Status New               Plan - 08/24/2014 1119    Clinical Impression Statement Pt is a 54 year old female s/p L CVA Jul 06, 2006. Pt was referred today for fabrication of splint and treatment of LUE per MD. Pt presents today with the following which impact her ability to be as indepdent as possible in basic ADL and simple IADL:  right dominant spastic hemplegia, pain in shoulder and hand, decreased PROM in RUE, decreased sensation, decreased balance.  Pt  and husband report that pt has fallen at  least two times in past 6 months therefore also feel pt could benefit from PT eval as well as referral to rehab physician to assist in tone and pain management. Pt and husband agreeable.    Pt will benefit from skilled therapeutic intervention in order to improve on the following deficits (Retired) Decreased activity tolerance;Decreased balance;Decreased knowledge of use of DME;Decreased mobility;Decreased range of motion;Difficulty walking;Decreased strength;Impaired UE functional use;Impaired tone;Impaired sensation;Pain   Rehab Potential Good   OT Frequency 2x / week   OT Duration 8 weeks   OT Treatment/Interventions Self-care/ADL training;Ultrasound;Moist Heat;DME and/or AE instruction;Neuromuscular education;Therapeutic exercise;Functional Mobility Training;Manual Therapy;Splinting;Therapeutic activities;Balance training;Patient/family education   Plan intiate HEP, initiate splint   Consulted and Agree with Plan of Care Patient;Family member/caregiver   Family Member Consulted husband          G-Codes - 08-24-2014 07/06/1127    Functional Limitation Self care   Self Care Current Status [phone]) At least 40 percent but less than 60 percent impaired, limited or restricted   Self Care Goal Status (U0454) At least 20 percent but less than 40 percent impaired, limited or restricted      Problem List Patient Active Problem List   Diagnosis Date  Noted  . Dyspnea 04/29/2014  . Vocal cord dysfunction 04/29/2014  . Cough 10/08/2013  . Wheezing 10/08/2013  . Dysphagia, unspecified(787.20) 09/22/2013  . Acute respiratory failure 09/21/2013  . Sinus tachycardia 09/21/2013  . Late effects of CVA (cerebrovascular accident) 09/21/2013  . Leukocytosis, unspecified 09/21/2013    Norton Pastel, OTR/L 24-Aug-2014, 11:32 AM  [facility] [address] [place], [place], [address] Phone: [phone]   Fax:  [phone]

## 2014-08-10 ENCOUNTER — Encounter: Payer: Medicare Other | Admitting: Occupational Therapy

## 2014-08-10 ENCOUNTER — Ambulatory Visit: Payer: Medicare Other | Admitting: Occupational Therapy

## 2014-08-10 DIAGNOSIS — Z7409 Other reduced mobility: Secondary | ICD-10-CM | POA: Diagnosis not present

## 2014-08-10 DIAGNOSIS — M25511 Pain in right shoulder: Secondary | ICD-10-CM | POA: Diagnosis not present

## 2014-08-10 DIAGNOSIS — G8111 Spastic hemiplegia affecting right dominant side: Secondary | ICD-10-CM

## 2014-08-10 DIAGNOSIS — M79641 Pain in right hand: Secondary | ICD-10-CM

## 2014-08-10 NOTE — Therapy (Signed)
[facility] [address] [place], [place], [address] Phone: [phone]   Fax:  [phone]  Occupational Therapy Treatment  Patient Details  Name: Andrea Townsend MRN: [id number] Date of Birth: 26-May-1960 Referring Provider:  Cain Saupe, MD  Encounter Date: 08/10/2014      OT End of Session - 08/10/14 1254    Visit Number 2   Number of Visits 16   Date for OT Re-Evaluation 10/04/14   Authorization Type MCR - G code needed   Authorization - Visit Number 2   Authorization - Number of Visits 10   OT Start Time 0800   OT Stop Time 0845   OT Time Calculation (min) 45 min   Activity Tolerance Patient tolerated treatment well      Past Medical History  Diagnosis Date  . Stroke   . Bronchitis     Past Surgical History  Procedure Laterality Date  . Cesarean section    . Abdominal surgery      There were no vitals filed for this visit.  Visit Diagnosis:  Spastic hemiplegia affecting right dominant side  Pain in joint, hand, right      Subjective Assessment - 08/10/14 0813    Subjective  "yes"  Pt is aphasic however appears to understand.     Patient is accompained by: Family member  spouse   Pertinent History see epic snapshot. Pt with LCVA in 2008   Patient Stated Goals Pt unable but points to arm; husband states main reason for referral is for new splint.   Currently in Pain? No/denies                      OT Treatments/Exercises (OP) - 08/10/14 0001    Splinting   Splinting Began fabrication and fitting of resting hand splint for improved hand positioning. To finish next session                  OT Short Term Goals - 08/09/14 1113    OT SHORT TERM GOAL #1   Title Pt and husband will be mod I with HEP - 09/06/2014   Status New   OT SHORT TERM GOAL #2   Title Pt will be mod I with wear and care of resting hand splint    Status New   OT SHORT TERM GOAL #3   Title Pt  and husband will demonstrate ability to employ at least two tone reduction techniques   OT SHORT TERM GOAL #4   Title Pt will be supervision for simple meal prep at ambulatory level   Status New   OT SHORT TERM GOAL #5   Title Pt will demonstrate understanding of strategies for cutting and tying shoes with AE   Status New   Additional Short Term Goals   Additional Short Term Goals Yes   OT SHORT TERM GOAL #6   Title Pt will tolerate 80* of shoulder flexion without pain to aide in ease of ADL's   Status New           OT Long Term Goals - 08/09/14 1117    OT LONG TERM GOAL #1   Title Pt and husband will be mod I with upgraded HEP prn - 10/04/2014   Status New   OT LONG TERM GOAL #2   Title Pt and husband will demonstrate understanding of medical options for tone management in LUE   Status New   OT LONG TERM  GOAL #3   Title Pt will be mod I for toileting   Status New   OT LONG TERM GOAL #4   Title Pt will be mod I for LB bathing   Status New   OT LONG TERM GOAL #5   Title Pt will be mod I for simple hot meal prep   Status New               Plan - 08/10/14 1254    Clinical Impression Statement Began fabrication of resting hand splint for better positioning of Rt hand and to prevent skin breakdown of palm. Approximating STG #2   Plan Finish resting hand splint, initiate HEP if time allows   Consulted and Agree with Plan of Care Patient;Family member/caregiver   Family Member Consulted husband          G-Codes - August 18, 2014 Jun 22, 1127    Functional Limitation Self care   Self Care Current Status [phone]) At least 40 percent but less than 60 percent impaired, limited or restricted   Self Care Goal Status ([id number]) At least 20 percent but less than 40 percent impaired, limited or restricted      Problem List Patient Active Problem List   Diagnosis Date Noted  . Dyspnea 04/29/2014  . Vocal cord dysfunction 04/29/2014  . Cough 10/08/2013  . Wheezing 10/08/2013  .  Dysphagia, unspecified(787.20) 09/22/2013  . Acute respiratory failure 09/21/2013  . Sinus tachycardia 09/21/2013  . Late effects of CVA (cerebrovascular accident) 09/21/2013  . Leukocytosis, unspecified 09/21/2013    Kelli Churn, OTR/L 08/10/2014, 12:56 PM  [facility] [address] [place], [place], [address] Phone: [phone]   Fax:  [phone]

## 2014-08-17 ENCOUNTER — Ambulatory Visit: Payer: Medicare Other | Admitting: Occupational Therapy

## 2014-08-17 ENCOUNTER — Encounter: Payer: Self-pay | Admitting: Occupational Therapy

## 2014-08-17 DIAGNOSIS — G8111 Spastic hemiplegia affecting right dominant side: Secondary | ICD-10-CM | POA: Diagnosis not present

## 2014-08-17 DIAGNOSIS — M79641 Pain in right hand: Secondary | ICD-10-CM | POA: Diagnosis not present

## 2014-08-17 DIAGNOSIS — Z7409 Other reduced mobility: Secondary | ICD-10-CM | POA: Diagnosis not present

## 2014-08-17 DIAGNOSIS — M25511 Pain in right shoulder: Secondary | ICD-10-CM | POA: Diagnosis not present

## 2014-08-17 NOTE — Therapy (Signed)
[facility] [address] [place], [place], [address] Phone: [phone]   Fax:  [phone]  Occupational Therapy Treatment  Patient Details  Name: Andrea Townsend MRN: [id number] Date of Birth: Jan 19, 1961 Referring Provider:  Cain Saupe, MD  Encounter Date: 08/17/2014      OT End of Session - 08/17/14 0901    Visit Number 3   Number of Visits 16   Date for OT Re-Evaluation 10/04/14   Authorization Type MCR - G code needed   Authorization - Visit Number 3   Authorization - Number of Visits 10   OT Start Time 0805   OT Stop Time 0845   OT Time Calculation (min) 40 min   Activity Tolerance Patient tolerated treatment well      Past Medical History  Diagnosis Date  . Stroke   . Bronchitis     Past Surgical History  Procedure Laterality Date  . Cesarean section    . Abdominal surgery      There were no vitals filed for this visit.  Visit Diagnosis:  Spastic hemiplegia affecting right dominant side      Subjective Assessment - 08/17/14 0807    Patient is accompained by: Family member  husband   Pertinent History see epic snapshot. Pt with LCVA in 2008   Patient Stated Goals Pt unable but points to arm; husband states main reason for referral is for new splint.   Currently in Pain? No/denies                      OT Treatments/Exercises (OP) - 08/17/14 0001    Neurological Re-education Exercises   Other Exercises 1 Pt shown self ROM stretches to perform for elbow extension and sh. flexion first into gravity and then against gravity seated. Pt/spouse also shown how to properly stretch hand (fingers and wrist) to prevent deviation and/or hyperextension of joints. Pt/husband verbalized understanding   Splinting   Splinting Finished fabrication and fitting of resting hand splint and issued.                 OT Education - 08/17/14 0831    Education provided Yes   Education  Details splint wear and care   Person(s) Educated Patient;Spouse   Methods Explanation;Handout   Comprehension Verbalized understanding          OT Short Term Goals - 08/09/14 1113    OT SHORT TERM GOAL #1   Title Pt and husband will be mod I with HEP - 09/06/2014   Status New   OT SHORT TERM GOAL #2   Title Pt will be mod I with wear and care of resting hand splint    Status New   OT SHORT TERM GOAL #3   Title Pt and husband will demonstrate ability to employ at least two tone reduction techniques   OT SHORT TERM GOAL #4   Title Pt will be supervision for simple meal prep at ambulatory level   Status New   OT SHORT TERM GOAL #5   Title Pt will demonstrate understanding of strategies for cutting and tying shoes with AE   Status New   Additional Short Term Goals   Additional Short Term Goals Yes   OT SHORT TERM GOAL #6   Title Pt will tolerate 80* of shoulder flexion without pain to aide in ease of ADL's   Status New           OT Long  Term Goals - 08/09/14 1117    OT LONG TERM GOAL #1   Title Pt and husband will be mod I with upgraded HEP prn - 10/04/2014   Status New   OT LONG TERM GOAL #2   Title Pt and husband will demonstrate understanding of medical options for tone management in LUE   Status New   OT LONG TERM GOAL #3   Title Pt will be mod I for toileting   Status New   OT LONG TERM GOAL #4   Title Pt will be mod I for LB bathing   Status New   OT LONG TERM GOAL #5   Title Pt will be mod I for simple hot meal prep   Status New               Plan - 08/17/14 0902    Clinical Impression Statement Pt issued splint today with education provided to pt and husband.    Plan wt bearing, self ROM to reduce tone and issue HEP    Consulted and Agree with Plan of Care Patient;Family member/caregiver   Family Member Consulted husband        Problem List Patient Active Problem List   Diagnosis Date Noted  . Dyspnea 04/29/2014  . Vocal cord dysfunction  04/29/2014  . Cough 10/08/2013  . Wheezing 10/08/2013  . Dysphagia, unspecified(787.20) 09/22/2013  . Acute respiratory failure 09/21/2013  . Sinus tachycardia 09/21/2013  . Late effects of CVA (cerebrovascular accident) 09/21/2013  . Leukocytosis, unspecified 09/21/2013    Kelli ChurnBallie, Ameris Akamine Johnson, OTR/L 08/17/2014, 9:08 AM  [facility] [address] [place], [place], [address] Phone: [phone]   Fax:  [phone]

## 2014-08-17 NOTE — Patient Instructions (Signed)
Your Splint This splint should initially be fitted by a healthcare practitioner.  The healthcare practitioner is responsible for providing wearing instructions and precautions to the patient, other healthcare practitioners and care provider involved in the patient's care.  This splint was custom made for you. Please read the following instructions to learn about wearing and caring for your splint.  Precautions Should your splint cause any of the following problems, remove the splint immediately and contact your therapist/physician.  Swelling  Severe Pain  Pressure Areas  Stiffness  Numbness  Do not wear your splint while operating machinery unless it has been fabricated for that purpose.  When To Wear Your Splint Where your splint according to your therapist/physician instructions. Nighttime and up to 8  hours while awake  Care and Cleaning of Your Splint 1. Keep your splint away from open flames. 2. Your splint will lose its shape in temperatures over 135 degrees Farenheit, ( in car windows, near radiators, ovens or in hot water).  Never make any adjustments to your splint, if the splint needs adjusting remove it and make an appointment to see your therapist. 3. Your splint may be cleaned with rubbing alcohol.  Do not immerse in hot water over 135 degrees Farenheit. 4. Straps may be washed with soap and water, but do not moisten the self-adhesive portion. 5. For ink or hard to remove spots use a scouring cleanser which contains chlorine.  Rinse the splint thoroughly after using chlorine cleanser. 6.

## 2014-08-19 ENCOUNTER — Ambulatory Visit: Payer: Medicare Other | Admitting: Occupational Therapy

## 2014-08-24 ENCOUNTER — Ambulatory Visit: Payer: Medicare Other | Admitting: Occupational Therapy

## 2014-08-26 ENCOUNTER — Ambulatory Visit: Payer: Medicare Other | Admitting: Occupational Therapy

## 2014-08-26 ENCOUNTER — Telehealth: Payer: Self-pay | Admitting: Occupational Therapy

## 2014-08-26 NOTE — Telephone Encounter (Signed)
Therapist called and spoke with pt's husband as her last 3 appointments have been cancelled at the last minute . Therapist asked if we need to cancel reamaining appointments. Pt's husband reports his work schedule has changed but he will get her here for her next appoint at 8 Am.Therapist said if she is unable to make the next appointment we will need to think about cancelling remaining appointments.

## 2014-08-31 ENCOUNTER — Ambulatory Visit: Payer: Medicare Other | Attending: Family Medicine | Admitting: Occupational Therapy

## 2014-08-31 DIAGNOSIS — Z7409 Other reduced mobility: Secondary | ICD-10-CM | POA: Diagnosis not present

## 2014-08-31 DIAGNOSIS — M79641 Pain in right hand: Secondary | ICD-10-CM | POA: Insufficient documentation

## 2014-08-31 DIAGNOSIS — M25511 Pain in right shoulder: Secondary | ICD-10-CM | POA: Diagnosis not present

## 2014-08-31 NOTE — Therapy (Addendum)
[facility] [address] [place], [place], [address] Phone: [phone]   Fax:  [phone]  Occupational Therapy Treatment  Patient Details  Name: Andrea Townsend MRN: [id number] Date of Birth: [birth year] Referring Provider:  Cain Saupe, MD  Encounter Date: 08/31/2014      OT End of Session - 08/31/14 0923    Visit Number 4   Number of Visits 16   Date for OT Re-Evaluation 10/04/14   Authorization Type MCR - G code needed   Authorization - Visit Number 4   Authorization - Number of Visits 10   OT Start Time 0805   OT Stop Time 0848   OT Time Calculation (min) 43 min   Activity Tolerance Patient tolerated treatment well;Other (comment)  Pt demonstrated lability and was tearful at times.   Behavior During Therapy [facility] for tasks assessed/performed      Past Medical History  Diagnosis Date  . Stroke   . Bronchitis     Past Surgical History  Procedure Laterality Date  . Cesarean section    . Abdominal surgery      There were no vitals filed for this visit.  Visit Diagnosis:  Pain in joint involving shoulder region, right  Pain in joint, hand, right  Impaired functional mobility and activity tolerance      Subjective Assessment - 08/31/14 0807    Subjective  Denies pain   Patient is accompained by: Family member   Pertinent History see epic snapshot. Pt with LCVA in 2008   Patient Stated Goals Pt unable but points to arm; husband states main reason for referral is for new splint.   Currently in Pain? No/denies        Treatment: Pt and husband were educated in initial HEP.(See pt instructions) Pt performed self ROM shoulder flexion in supine and seated. Pt's husband returned demonstration of P/ROM shoulder abduction in supine, and wrist flexion, with gentle finger extension. Pt was tearful at times while performing tableslides seated at tabletop, demonstrating emotional lability. Therapist  adjusted pt's quad cane so that the base was turned the proper direction. Husand to call to schedule PT eval. His work schedule has changed and he will need to call back.                        OT Education - 08/31/14 [phone]    Education provided Yes   Education Details inital HEP   Person(s) Educated Patient;Spouse   Methods Explanation;Demonstration;Verbal cues;Handout   Comprehension Verbalized understanding;Returned demonstration;Verbal cues required  Needs reinforcement          OT Short Term Goals - 08/09/14 1113    OT SHORT TERM GOAL #1   Title Pt and husband will be mod I with HEP - 09/06/2014   Status New   OT SHORT TERM GOAL #2   Title Pt will be mod I with wear and care of resting hand splint    Status New   OT SHORT TERM GOAL #3   Title Pt and husband will demonstrate ability to employ at least two tone reduction techniques   OT SHORT TERM GOAL #4   Title Pt will be supervision for simple meal prep at ambulatory level   Status New   OT SHORT TERM GOAL #5   Title Pt will demonstrate understanding of strategies for cutting and tying shoes with AE   Status New   Additional Short Term Goals   Additional  Short Term Goals Yes   OT SHORT TERM GOAL #6   Title Pt will tolerate 80* of shoulder flexion without pain to aide in ease of ADL's   Status New           OT Long Term Goals - 08/09/14 1117    OT LONG TERM GOAL #1   Title Pt and husband will be mod I with upgraded HEP prn - 10/04/2014   Status New   OT LONG TERM GOAL #2   Title Pt and husband will demonstrate understanding of medical options for tone management in LUE   Status New   OT LONG TERM GOAL #3   Title Pt will be mod I for toileting   Status New   OT LONG TERM GOAL #4   Title Pt will be mod I for LB bathing   Status New   OT LONG TERM GOAL #5   Title Pt will be mod I for simple hot meal prep   Status New               Plan - 08/31/14 [id number]    Clinical Impression  Statement Pt and husband were instructed in intial HEP. Pt can benefit from reinforcement.   Rehab Potential Good   OT Frequency 2x / week   OT Duration 8 weeks   OT Treatment/Interventions Self-care/ADL training;Ultrasound;Moist Heat;DME and/or AE instruction;Neuromuscular education;Therapeutic exercise;Functional Mobility Training;Manual Therapy;Splinting;Therapeutic activities;Balance training;Patient/family education   Plan reinforce HEP,    Recommended Other Services Pt's husband to call back to schedule PT eval   Consulted and Agree with Plan of Care Patient;Family member/caregiver   Family Member Consulted husband        Problem List Patient Active Problem List   Diagnosis Date Noted  . Dyspnea 04/29/2014  . Vocal cord dysfunction 04/29/2014  . Cough 10/08/2013  . Wheezing 10/08/2013  . Dysphagia, unspecified(787.20) 09/22/2013  . Acute respiratory failure 09/21/2013  . Sinus tachycardia 09/21/2013  . Late effects of CVA (cerebrovascular accident) 09/21/2013  . Leukocytosis, unspecified 09/21/2013   Keene BreathKathryn Rine, OTR/L [phone] Phone: [phone] 9:25 AM 08/31/2014 RINE,KATHRYN 08/31/2014, 9:24 [facility] [address] [place], [place], [address] Phone: [phone]   Fax:  [phone]

## 2014-08-31 NOTE — Patient Instructions (Signed)
Laying on your back clasp right wrist with left hand, raise arm overhead 10-20 reps, 1-2 x day  Have your husband assist with bringing arm out to the side to shoulder height 10-20 reps 1x day  Bend right wrist up and down 10-20 reps 1x day  DennisBend your wrist down and have your husband assist with gently stretching fingers 10-20 reps 1-2x day  Perform table slides with hands on a towel, slide towel forwards and backwards, side to side and make circles, 10-20 reps each

## 2014-09-03 ENCOUNTER — Ambulatory Visit: Payer: Medicare Other | Admitting: Occupational Therapy

## 2014-09-07 ENCOUNTER — Encounter: Payer: Medicare Other | Admitting: Occupational Therapy

## 2014-09-09 ENCOUNTER — Encounter: Payer: Medicare Other | Admitting: Occupational Therapy

## 2014-09-13 ENCOUNTER — Encounter: Payer: Medicare Other | Admitting: Occupational Therapy

## 2014-09-16 ENCOUNTER — Ambulatory Visit: Payer: Medicare Other | Admitting: Internal Medicine

## 2014-10-20 ENCOUNTER — Telehealth: Payer: Self-pay | Admitting: Pulmonary Disease

## 2014-10-20 MED ORDER — BUDESONIDE-FORMOTEROL FUMARATE 160-4.5 MCG/ACT IN AERO: 2.0000 | INHALATION_SPRAY | Freq: Two times a day (BID) | RESPIRATORY_TRACT | Status: DC

## 2014-10-20 NOTE — Telephone Encounter (Signed)
Called and spoke to pt's husband. Pt requesting symbicort 160 sample. Sample left up front for pick up. Appt made with TP on 11/10/14. Pt's husband  verbalized understanding and denied any further questions or concerns at this time.

## 2014-11-10 ENCOUNTER — Encounter: Payer: Self-pay | Admitting: Adult Health

## 2014-11-10 ENCOUNTER — Ambulatory Visit (INDEPENDENT_AMBULATORY_CARE_PROVIDER_SITE_OTHER): Payer: Medicare Other | Admitting: Adult Health

## 2014-11-10 VITALS — BP 126/88 | HR 77 | Temp 98.7°F | Ht 63.0 in | Wt 190.0 lb

## 2014-11-10 DIAGNOSIS — J45909 Unspecified asthma, uncomplicated: Secondary | ICD-10-CM | POA: Insufficient documentation

## 2014-11-10 DIAGNOSIS — J309 Allergic rhinitis, unspecified: Secondary | ICD-10-CM | POA: Insufficient documentation

## 2014-11-10 DIAGNOSIS — R05 Cough: Secondary | ICD-10-CM | POA: Diagnosis not present

## 2014-11-10 DIAGNOSIS — J4530 Mild persistent asthma, uncomplicated: Secondary | ICD-10-CM

## 2014-11-10 DIAGNOSIS — R059 Cough, unspecified: Secondary | ICD-10-CM

## 2014-11-10 DIAGNOSIS — Z23 Encounter for immunization: Secondary | ICD-10-CM | POA: Diagnosis not present

## 2014-11-10 MED ORDER — ALBUTEROL SULFATE HFA 108 (90 BASE) MCG/ACT IN AERS: 2.0000 | INHALATION_SPRAY | RESPIRATORY_TRACT | Status: DC | PRN

## 2014-11-10 NOTE — Assessment & Plan Note (Signed)
Controlled on Nasocort 

## 2014-11-10 NOTE — Progress Notes (Signed)
I agree with this plan of care 

## 2014-11-10 NOTE — Progress Notes (Signed)
   Subjective:    Patient ID: Andrea Townsend, female    DOB: May 23, 1960, 54 y.o.   MRN: [id number]  HPI 54 year old female with prior stroke/right sided and swallow deficits. She is followed for presumed asthma with recurrent wheezing and elevate IgE and eosinophil count.  11/10/2014 Follow up : Asthma  Pt. returns for 6 month follow up for asthma with allergy component.She has been flare free since starting the Simbicort 6 months ago.Rare albuterol use. Symbicort is expensive. Suggested to husband to check with pharmacy for formulary. Pt. Using Nasocort to help with nasal symptoms. Pt. Denies any wheezing cough, nocturnal symptoms. Pt. States her swallow is improved.   Review of Systems Constitutional:   No  weight loss, night sweats,  Fevers, chills, fatigue, or  lassitude.  HEENT:   No headaches,  Improved swallowing,  Tooth/dental problems, or  Sore throat,                No sneezing, itching, ear ache, + nasal congestion, post nasal drip,   CV:  No chest pain,  Orthopnea, PND, swelling in lower extremities, anasarca, dizziness, palpitations, syncope.   GI  No heartburn, indigestion, abdominal pain, nausea, vomiting, diarrhea, change in bowel habits, loss of appetite, bloody stools.Chronic swallow issues.   Resp: No shortness of breath with exertion or at rest.  No excess mucus, no productive cough,  No non-productive cough,  No coughing up of blood.  No change in color of mucus.  No wheezing.  No chest wall deformity  Skin: no rash or lesions.  GU: no dysuria, change in color of urine, no urgency or frequency.  No flank pain, no hematuria   MS:  No joint pain or swelling.  No decreased range of motion.  No back pain.  Psych:  No change in mood or affect. No depression or anxiety.  No memory loss.Some expressive aphasia noted, per husband it is improving.         Objective:   Physical Exam GEN: A/Ox3; pleasant , NAD, well nourished , in wheelchair  HEENT:  [employer]/AT,   EACs-clear, TMs-wnl, NOSE-clear, THROAT-clear, no lesions, no postnasal drip or exudate noted.   NECK:  Supple w/ fair ROM; no JVD; normal carotid impulses w/o bruits; no thyromegaly or nodules palpated; no lymphadenopathy.  RESP  Clear  P & A; w/o, wheezes/ rales/ or rhonchi.no accessory muscle use, no dullness to percussion  CARD:  RRR, no m/r/g  , no peripheral edema, pulses intact, no cyanosis or clubbing.  GI:   Soft & nt; nml bowel sounds; no organomegaly or masses detected.  Musco: Warm bil, no deformities or joint swelling noted.   Neuro: alert, right sided weakness, and expressive aphasia as residuals from previous stroke..    Skin: Warm, no lesions or rashes         Assessment & Plan:

## 2014-11-10 NOTE — Patient Instructions (Addendum)
Continue on Symbicort 2 puffs Twice daily  , rinse after use.  Call back with your formulary .  Saline nasal rinses As needed   Continue on Nasacort AQ 2 puffs daily  Flu shot today .  Return for your PFT .  Follow up .Dr. Kendrick Fries in 6 months and As needed

## 2014-11-10 NOTE — Assessment & Plan Note (Signed)
Presumed asthma with allergic component. Will need PFT's. Improved control on Symbicort and Nasocort.  Plan: Continue on Symbicort 2 puffs Twice daily  , rinse after use.  Call back with your formulary .  Saline nasal rinses As needed   Continue on Nasacort AQ 2 puffs daily  Flu shot today .  Return for your PFT .  Follow up .Dr. Kendrick Fries in 6 months and As needed

## 2014-12-27 ENCOUNTER — Encounter: Payer: Self-pay | Admitting: Occupational Therapy

## 2014-12-27 NOTE — Therapy (Signed)
[facility] [address] [place], [place], [address] Phone: [phone]   Fax:  [phone]  Patient Details  Name: Andrea Townsend MRN: [id number] Date of Birth: 03-12-1960 Referring Provider:  No ref. provider found  Encounter Date: 12/27/2014  OCCUPATIONAL THERAPY DISCHARGE SUMMARY  Visits from Start of Care: 4  Current functional level related to goals / functional outcomes: Pt did not meet any STG's or LTG's secondary to not returning to O.T. After 4th visit on 08/31/14   Remaining deficits: Same as initial evaluation   Education / Equipment: HEP  Plan: Patient agrees to discharge.  Patient goals were not met. Patient is being discharged due to not returning since the last visit.  ?????       Carey Bullocks, OTR/L 12/27/2014, 11:08 AM  [facility] [address] [place], [place], [address] Phone: [phone]   Fax:  [phone]

## 2015-05-03 ENCOUNTER — Encounter: Payer: Self-pay | Admitting: Pulmonary Disease

## 2015-05-03 ENCOUNTER — Ambulatory Visit (INDEPENDENT_AMBULATORY_CARE_PROVIDER_SITE_OTHER): Payer: Medicare Other | Admitting: Pulmonary Disease

## 2015-05-03 VITALS — BP 142/78 | HR 79 | Temp 98.3°F | Ht 63.0 in | Wt 224.2 lb

## 2015-05-03 DIAGNOSIS — J4530 Mild persistent asthma, uncomplicated: Secondary | ICD-10-CM | POA: Diagnosis not present

## 2015-05-03 DIAGNOSIS — R05 Cough: Secondary | ICD-10-CM | POA: Diagnosis not present

## 2015-05-03 DIAGNOSIS — R059 Cough, unspecified: Secondary | ICD-10-CM

## 2015-05-03 DIAGNOSIS — J309 Allergic rhinitis, unspecified: Secondary | ICD-10-CM | POA: Diagnosis not present

## 2015-05-03 MED ORDER — BUDESONIDE-FORMOTEROL FUMARATE 160-4.5 MCG/ACT IN AERO: 2.0000 | INHALATION_SPRAY | Freq: Two times a day (BID) | RESPIRATORY_TRACT | Status: DC

## 2015-05-03 MED ORDER — ALBUTEROL SULFATE HFA 108 (90 BASE) MCG/ACT IN AERS: 2.0000 | INHALATION_SPRAY | RESPIRATORY_TRACT | Status: DC | PRN

## 2015-05-03 NOTE — Assessment & Plan Note (Signed)
Cough is better. Will observe. Need to rx asthma.

## 2015-05-03 NOTE — Assessment & Plan Note (Signed)
AR seems stable. Cont nasacort.

## 2015-05-03 NOTE — Patient Instructions (Signed)
1. Start albuterol 2 puffs every 4 hours as needed for shortness of breath. 2. Continue with Symbicort, 160/4.5, 1-2 puffs twice a day. 3. We will schedule you for a breathing test. 4. Call the office if your asthma is not controlled. You  might need to be seen sooner.  Return to clinic in 6 months.

## 2015-05-03 NOTE — Progress Notes (Signed)
Subjective:    Patient ID: Andrea Townsend, female    DOB: 12-14-1960, 55 y.o.   MRN: [id number]   HPI  Synopsis: This is a very pleasant 55 year old female, who is here as an urgent/follow up visit, sees Dr. 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  Last time she was seen in the office was in September/2016 as an urgent visit. She was seen by Tammy Parrett. She had issues getting Symbicort because of expense.    ROV (05/03/15) since last seen, she states that her asthma has been overall stable. There were not able to go to the pharmacy and figure out cheaper alternatives. She got  2 Symbicort samples last visit and those lasted for 6 months. She uses them when necessary when her asthma starts flaring up. Has not been admitted nor been on antibiotics since last seen. The last couple days, she started to have wheezing related to change in weather.     Review of Systems  Constitutional: Negative.  Negative for fever, chills and fatigue.  HENT: Negative.  Negative for nosebleeds, postnasal drip and rhinorrhea.   Eyes: Negative.   Respiratory: Positive for cough, shortness of breath and wheezing.   Cardiovascular: Negative for chest pain, palpitations and leg swelling.  Gastrointestinal: Negative.   Endocrine: Negative.   Genitourinary: Negative.   Musculoskeletal: Negative.   Allergic/Immunologic: Negative.   Neurological: Negative.   Hematological: Negative.   Psychiatric/Behavioral: Negative.   All other systems reviewed and are negative.      Objective:   Physical Exam Filed Vitals:   05/03/15 0931  BP: 142/78  Pulse: 79  Temp: 98.3 F (36.8 C)  TempSrc: Oral  Height:  (1.6 m)  Weight: 224 lb 3.2 oz (101.696 kg)  SpO2: 97%  RA  Vitals:  Filed Vitals:   05/03/15 0931  BP: 142/78  Pulse: 79  Temp: 98.3 F (36.8 C)  TempSrc: Oral  Height:  (1.6 m)  Weight: 224 lb 3.2 oz (101.696 kg)  SpO2: 97%     Constitutional/General:  Pleasant, well-nourished, well-developed, not in any distress,  Comfortably seating on her wheelchair.  Well kempt  Body mass index is 39.73 kg/(m^2). Wt Readings from Last 3 Encounters:  05/03/15 224 lb 3.2 oz (101.696 kg)  11/10/14 190 lb (86.183 kg)  04/29/14 215 lb (97.523 kg)    HEENT: Pupils equal and reactive to light and accommodation. Anicteric sclerae. Normal nasal mucosa.   No oral  lesions,  mouth clear,  oropharynx clear, no postnasal drip. (-) Oral thrush. No dental caries.  Airway - Mallampati class III  Neck: No masses. Midline trachea. No JVD, (-) LAD. (-) bruits appreciated.  Respiratory/Chest: Grossly normal chest. (-) deformity. (-) Accessory muscle use.  Symmetric expansion. (-) Tenderness on palpation.  Resonant on percussion.  Diminished BS on both lower lung zones. (-) wheezing, (+) occl crackles at bases. (-) rhonchi (-) egophony  Cardiovascular: Regular rate and  rhythm, heart sounds normal, no murmur or gallops, no peripheral edema  Gastrointestinal:  Normal bowel sounds. Soft, non-tender. No hepatosplenomegaly.  (-) masses.   Musculoskeletal:  Normal muscle tone. Normal gait.   Extremities: Grossly normal. (-) clubbing, cyanosis.  (-) edema  Skin: (-) rash,lesions seen.   Neurological/Psychiatric : alert, oriented to time, place, person. Normal mood and affect          Assessment & Plan:   Asthma, chronic Presumed asthma with allergic component.  Patient has used 2 Symbicort  samples to last 6 months. Has not gone to the pharmacy to check cheaper alternative to Symbicort. Has not done PFTs.  Plan: Continue on Symbicort 2 puffs Twice daily  , rinse after use. We'll give her a sample. Start albuterol, 2 puffs every 4 hours when necessary. Advised patient and husband to check cheaper alternatives for medicine at her pharmacy.  Will need PFTs.  Discussed with patient regarding asthma plan. Follow-up in 6  months or sooner if your asthma starts flaring up.    Cough Cough is better. Will observe. Need to rx asthma.   Allergic rhinitis AR seems stable. Cont nasacort.      Return to clinic in 6 months.

## 2015-05-03 NOTE — Assessment & Plan Note (Addendum)
Presumed asthma with allergic component.  Patient has used 2 Symbicort samples to last 6 months. Has not gone to the pharmacy to check cheaper alternative to Symbicort. Has not done PFTs.  Plan: Continue on Symbicort 2 puffs Twice daily  , rinse after use. We'll give her a sample. Start albuterol, 2 puffs every 4 hours when necessary. Advised patient and husband to check cheaper alternatives for medicine at her pharmacy.  Will need PFTs.  Discussed with patient regarding asthma plan. Follow-up in 6 months or sooner if your asthma starts flaring up.

## 2016-02-19 IMAGING — CR DG CHEST 2V
2 series · 2 of 2 positions shown · non-contrast
Comparison: 07/06/2006

CLINICAL DATA: Persistent cough

EXAM:
CHEST  2 VIEW

[view not recorded (1 of 2)]
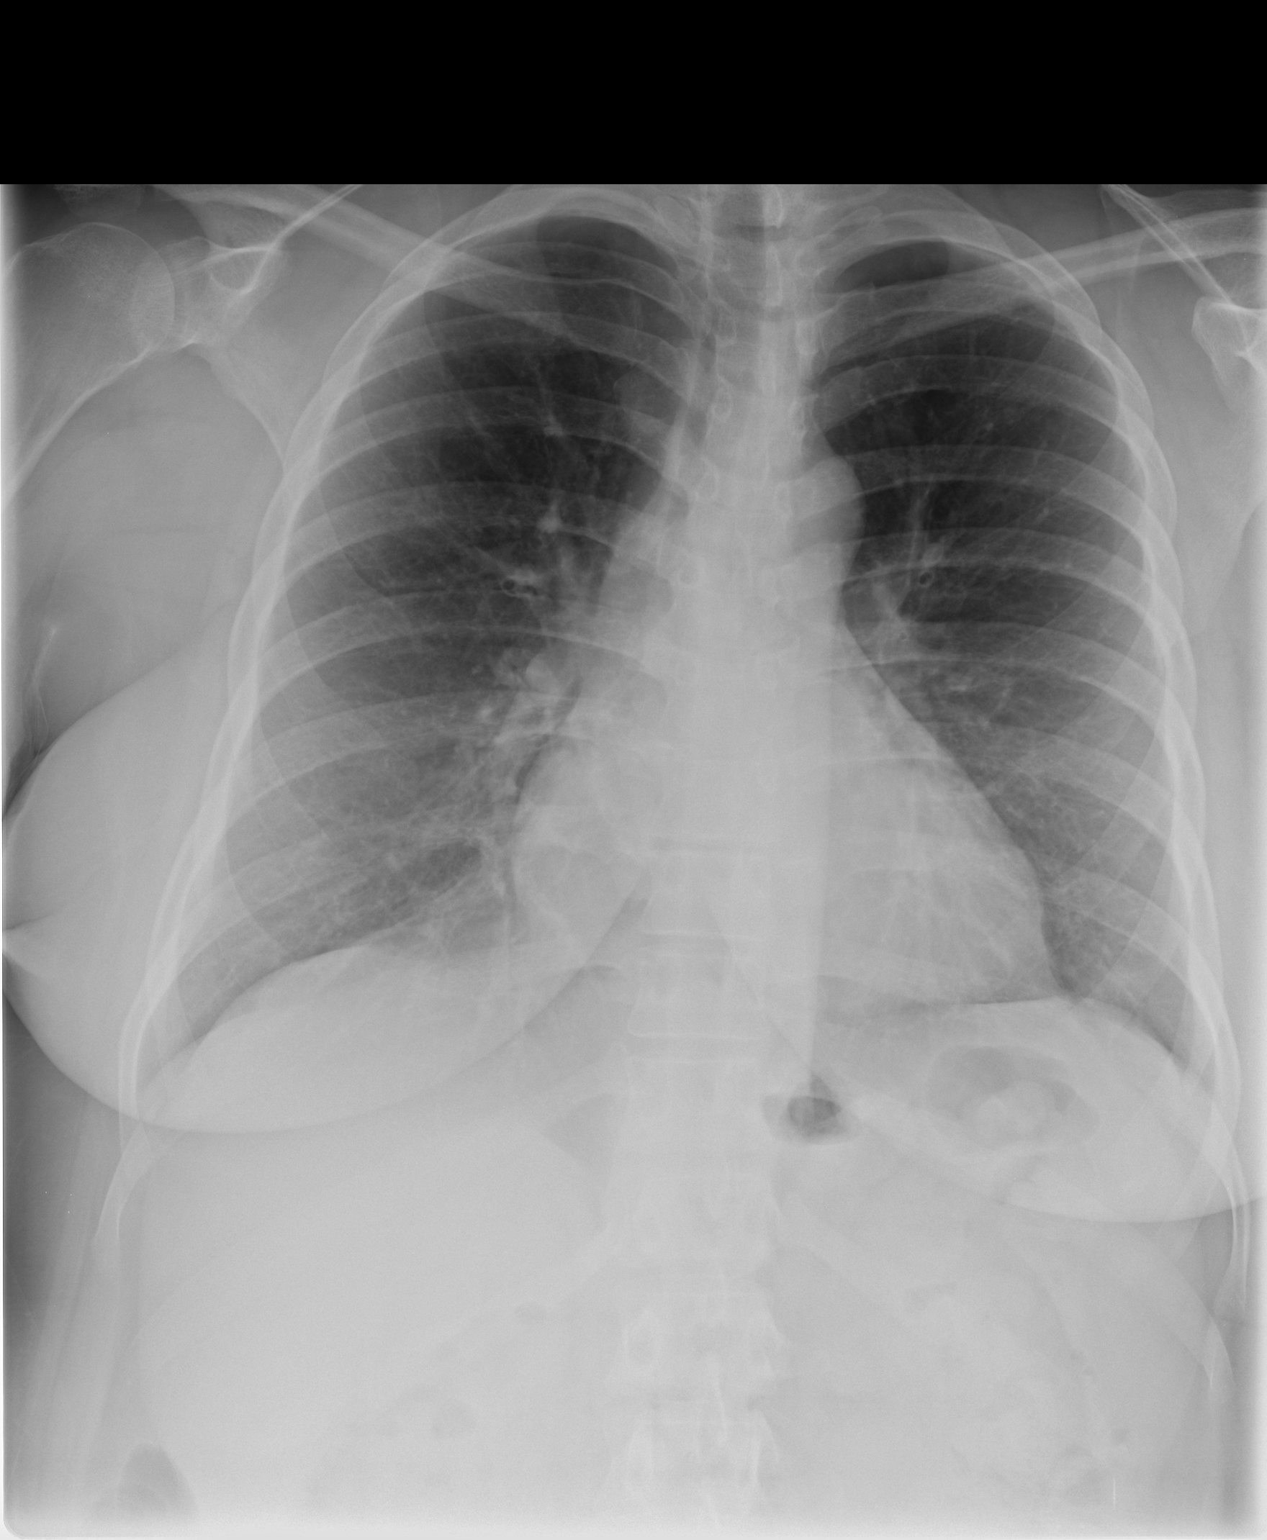

[view not recorded (2 of 2)]
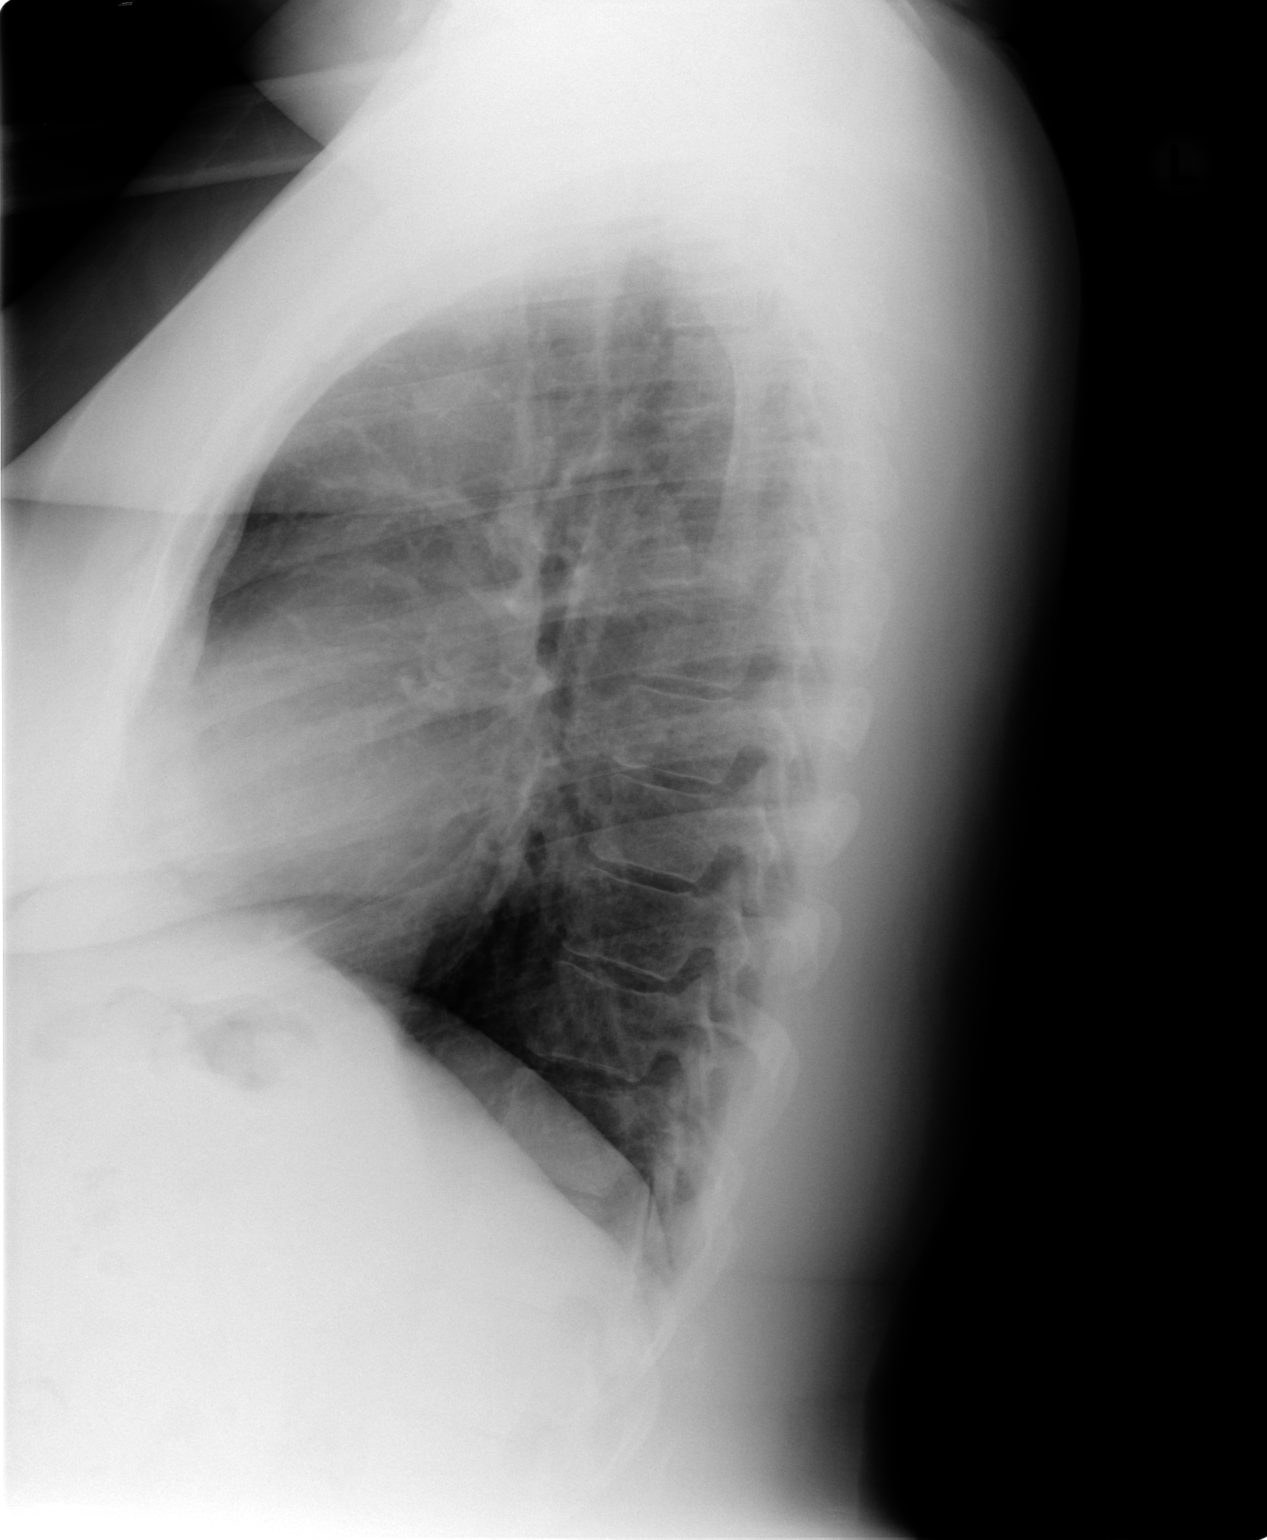

[2 of 2 positions shown; findings below may reference images not displayed]

FINDINGS: Cardiomediastinal silhouette is unremarkable. No acute infiltrate or
pleural effusion. No pulmonary edema. Central mild bronchitic
changes. Bony thorax is unremarkable.
IMPRESSION: No acute infiltrate or pulmonary edema. Central mild bronchitic
changes.

## 2016-09-27 DIAGNOSIS — F411 Generalized anxiety disorder: Secondary | ICD-10-CM | POA: Diagnosis not present

## 2016-09-27 DIAGNOSIS — R350 Frequency of micturition: Secondary | ICD-10-CM | POA: Diagnosis not present

## 2016-09-27 DIAGNOSIS — N951 Menopausal and female climacteric states: Secondary | ICD-10-CM | POA: Diagnosis not present

## 2017-07-05 DIAGNOSIS — N951 Menopausal and female climacteric states: Secondary | ICD-10-CM | POA: Diagnosis not present

## 2017-07-05 DIAGNOSIS — E669 Obesity, unspecified: Secondary | ICD-10-CM | POA: Diagnosis not present

## 2017-07-05 DIAGNOSIS — F411 Generalized anxiety disorder: Secondary | ICD-10-CM | POA: Diagnosis not present

## 2017-07-05 DIAGNOSIS — I69320 Aphasia following cerebral infarction: Secondary | ICD-10-CM | POA: Diagnosis not present

## 2017-07-05 DIAGNOSIS — I69351 Hemiplegia and hemiparesis following cerebral infarction affecting right dominant side: Secondary | ICD-10-CM | POA: Diagnosis not present

## 2017-07-05 DIAGNOSIS — R030 Elevated blood-pressure reading, without diagnosis of hypertension: Secondary | ICD-10-CM | POA: Diagnosis not present

## 2017-07-19 DIAGNOSIS — Z1211 Encounter for screening for malignant neoplasm of colon: Secondary | ICD-10-CM | POA: Diagnosis not present

## 2017-07-19 DIAGNOSIS — Z1389 Encounter for screening for other disorder: Secondary | ICD-10-CM | POA: Diagnosis not present

## 2017-07-19 DIAGNOSIS — I69351 Hemiplegia and hemiparesis following cerebral infarction affecting right dominant side: Secondary | ICD-10-CM | POA: Diagnosis not present

## 2017-07-19 DIAGNOSIS — E669 Obesity, unspecified: Secondary | ICD-10-CM | POA: Diagnosis not present

## 2017-07-19 DIAGNOSIS — I10 Essential (primary) hypertension: Secondary | ICD-10-CM | POA: Diagnosis not present

## 2017-07-19 DIAGNOSIS — Z1239 Encounter for other screening for malignant neoplasm of breast: Secondary | ICD-10-CM | POA: Diagnosis not present

## 2017-07-19 DIAGNOSIS — Z0000 Encounter for general adult medical examination without abnormal findings: Secondary | ICD-10-CM | POA: Diagnosis not present

## 2017-08-24 ENCOUNTER — Other Ambulatory Visit: Payer: Self-pay | Admitting: Adult Health

## 2017-08-26 ENCOUNTER — Telehealth: Payer: Self-pay | Admitting: Adult Health

## 2017-08-26 NOTE — Telephone Encounter (Signed)
Call was disconnected during conversation with pt's spouse.Andrea Townsend.  lmtcb x1 for Continental Airlinesicky.  Pt needs apt prior to refills- last seen AD 05/03/15.

## 2017-08-26 NOTE — Telephone Encounter (Signed)
Pt's husband is calling back [phone]

## 2017-08-26 NOTE — Telephone Encounter (Signed)
Pt husband is calling back [phone]

## 2017-08-26 NOTE — Telephone Encounter (Signed)
Spoke with pt's spouse, states that pt has been experiencing increased sob, chest congestion.  Denies fever, chills, sinus congestion.  Pt has been experiencing this Xseveral days since running out of her albuterol inhaler.  Pt is scheduled to see Arlys JohnBrian on Thursday 7/11, requesting recs in the meantime. [facility] [address].  Last office visit: 05/03/15 Sending to DOD as pt was an AD pt.  VS please advise.  Thanks!

## 2017-08-26 NOTE — Telephone Encounter (Signed)
Patient husband called back; decline an appointment for today; scheduled appointment for 08/29/17 at 11:45 with NP Cherre HugerMack; still requesting medication because wife is having trouble breathing and congested.

## 2017-08-26 NOTE — Telephone Encounter (Signed)
lmtcb for spouse Clide CliffRicky.  Will send albuterol rx after speaking to pt.

## 2017-08-26 NOTE — Telephone Encounter (Signed)
Please send refill for her albuterol.  She can then have further assessment with Arlys JohnBrian on 7/11.

## 2017-08-27 MED ORDER — ALBUTEROL SULFATE HFA 108 (90 BASE) MCG/ACT IN AERS: 2.0000 | INHALATION_SPRAY | RESPIRATORY_TRACT | 0 refills | Status: DC | PRN

## 2017-08-27 NOTE — Telephone Encounter (Signed)
Patient's spouse Clide CliffRicky returned call Informed [name] that Ventolin HFA has been sent to the pharmacy [name] verified appt date/time with Arlys JohnBrian NP on 7/11  Nothing further needed; will sign off

## 2017-08-27 NOTE — Telephone Encounter (Signed)
lmtcb for pt's spouse.  Albuterol has been sent to preferred pharmacy.

## 2017-08-28 NOTE — Progress Notes (Signed)
@Patient  ID: Andrea Townsend, female    DOB: 1960-05-24, 57 y.o.   MRN: [id number]  Chief Complaint  Patient presents with  . Acute Visit    Increased SOB, wheezing, productive cough x 3 weeks.     Referring provider: No ref. provider found  HPI: Synopsis: This is a very pleasant 57 year old female, who is here as acute, sees Dr. 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  Last time she was seen in the office was in 05/03/15 as an urgent visit.     Recent [facility] Encounters:   05/03/15 - OV - Dios  ROV (05/03/15) since last seen, she states that her asthma has been overall stable. There were not able to go to the pharmacy and figure out cheaper alternatives. She got  2 Symbicort samples last visit and those lasted for 6 months. She uses them when necessary when her asthma starts flaring up. Has not been admitted nor been on antibiotics since last seen. The last couple days, she started to have wheezing related to change in weather. Plan: Continue on Symbicort 2 puffs Twice daily  Start albuterol, 2 puffs every 4 hours when necessary, Advised patient and husband to check cheaper alternatives for medicine at her pharmacy, Will need PFTs, Discussed with patient regarding asthma plan. Follow-up in 6 months or sooner if your asthma starts flaring up.     Tests:   Imaging:  09/20/2013-chest x-ray-no active cardiopulmonary disease, lungs are clear  Cardiac:   Labs:  04/13/2014-allergy panel-IgE 560 09/21/2013-IgE-295.4 04/13/2014- CBC with differential-eosinophils-7.3  Micro:   Chart Review:  Multiple PFT orders have not been completed   08/29/17 Acute  Pleasant 57 year old patient seen office today.  Patient has been without her Symbicort since 2017.  Husband and patient are unaware of why symptoms started to flareup.  They are reporting 3 weeks of wheezing, shortness of breath, occasional cough.  Patient also reporting trouble sleeping due  to shortness of breath and wheezing.  They have been taking the rescue inhaler some but had difficulties obtaining a refill from [facility] pharmacy.  Patient and husband admit that they know there is post to complete pulmonary function testing but patient was feeling better so they did not come in.  They have not contacted our office to be seen for 2 years.  Having difficulties obtaining rescue inhaler from [facility] pharmacy.  As patient no longer has supplemental insurance coverage.  I informed patient they need to follow-up with [facility] pharmacy and update them they no longer have supplemental coverage and this is not something that we manage here.  Unfortunately patient has also not been seen by Dr. Kendrick Fries since 2016.  Will work with patient today to get patient seen by Dr. Kendrick Fries as well as to complete pulmonary function test.  Patient is adherent to Nasacort.  No Known Allergies  Immunization History  Administered Date(s) Administered  . Influenza,inj,Quad PF,6+ Mos 11/10/2014    Past Medical History:  Diagnosis Date  . Bronchitis   . Stroke [facility])     Tobacco History: Social History   Tobacco Use  Smoking Status Former Smoker  . Packs/day: 1.00  . Years: 20.00  . Pack years: 20.00  . Types: Cigarettes  . Last attempt to quit: 09/22/2006  . Years since quitting: 10.9  Smokeless Tobacco Never Used   Counseling given: Not Answered Continue not smoking.  Outpatient Encounter Medications as of 08/29/2017  Medication Sig  . albuterol (PROVENTIL HFA;VENTOLIN HFA)  108 (90 Base) MCG/ACT inhaler Inhale 2 puffs into the lungs every 4 (four) hours as needed for wheezing or shortness of breath.  . Biotin 1000 MCG tablet Take 1,000 mcg by mouth 3 (three) times daily.  . budesonide-formoterol (SYMBICORT) 160-4.5 MCG/ACT inhaler Inhale 2 puffs into the lungs 2 (two) times daily.  . Multiple Vitamin (MULTIVITAMIN WITH MINERALS) TABS tablet Take 1 tablet by mouth daily.  Marland Kitchen omega-3 acid  ethyl esters (LOVAZA) 1 G capsule Take 1 capsule by mouth 2 (two) times daily.  Marland Kitchen Spacer/Aero-Holding Chambers (AEROCHAMBER MV) inhaler Use as instructed  . budesonide-formoterol (SYMBICORT) 160-4.5 MCG/ACT inhaler Inhale 2 puffs into the lungs every 12 (twelve) hours.  . predniSONE (DELTASONE) 10 MG tablet Take 1 tablet (10 mg total) by mouth daily with breakfast.   No facility-administered encounter medications on file as of 08/29/2017.      Review of Systems  Review of Systems  Constitutional: Positive for fatigue. Negative for chills, fever and unexpected weight change.  HENT: Negative for congestion, ear pain, postnasal drip, rhinorrhea, sinus pressure, sinus pain, sneezing and sore throat.   Respiratory: Positive for cough, shortness of breath and wheezing. Negative for chest tightness.   Cardiovascular: Negative for chest pain and palpitations.  Gastrointestinal: Negative for blood in stool, diarrhea, nausea and vomiting.  Endocrine: Negative for polydipsia, polyphagia and polyuria.  Genitourinary: Negative for dysuria, frequency and urgency.  Musculoskeletal: Negative for arthralgias.       In wheelchair today  Skin: Negative for color change.  Allergic/Immunologic: Negative for environmental allergies, food allergies and immunocompromised state.  Neurological: Negative for dizziness, tremors, light-headedness and headaches.  Psychiatric/Behavioral: Positive for sleep disturbance. Negative for dysphoric mood. The patient is not nervous/anxious.        Physical Exam  BP 118/80   Pulse 77   Ht 5\' 3"  (1.6 m)   Wt 224 lb (101.6 kg)   SpO2 98%   BMI 39.68 kg/m   Wt Readings from Last 5 Encounters:  08/29/17 224 lb (101.6 kg)  05/03/15 224 lb 3.2 oz (101.7 kg)  11/10/14 190 lb (86.2 kg)  04/29/14 215 lb (97.5 kg)  03/11/14 220 lb (99.8 kg)     Physical Exam  Constitutional: She is oriented to person, place, and time and well-developed, well-nourished, and in no  distress. No distress.  HENT:  Head: Normocephalic and atraumatic.  Right Ear: Hearing, tympanic membrane, external ear and ear canal normal.  Left Ear: Hearing, tympanic membrane, external ear and ear canal normal.  Nose: Nose normal. Right sinus exhibits no maxillary sinus tenderness and no frontal sinus tenderness. Left sinus exhibits no maxillary sinus tenderness and no frontal sinus tenderness.  Mouth/Throat: Uvula is midline and oropharynx is clear and moist. No oropharyngeal exudate.  Eyes: Pupils are equal, round, and reactive to light.  Neck: Normal range of motion. Neck supple. No JVD present.  Cardiovascular: Normal rate, regular rhythm and normal heart sounds.  Pulmonary/Chest: Effort normal. No accessory muscle usage. No respiratory distress. She has no decreased breath sounds. She has wheezes (Inspiratory and expiratory wheeze on exam today, air movement all lobes). She has no rhonchi.  Abdominal: Soft. Bowel sounds are normal. There is no tenderness.  Musculoskeletal: She exhibits no edema.  Past medical history of stroke, and wheelchair today  Lymphadenopathy:    She has no cervical adenopathy.  Neurological: She is alert and oriented to person, place, and time. She displays abnormal speech (Chronic from CVA).  Skin: Skin is warm and  dry. She is not diaphoretic. No erythema.  Psychiatric: Mood, memory, affect and judgment normal.  Nursing note and vitals reviewed.    Lab Results:  CBC    Component Value Date/Time   WBC 7.2 04/13/2014 1654   RBC 4.64 04/13/2014 1654   HGB 14.3 04/13/2014 1654   HCT 43.3 04/13/2014 1654   PLT 323.0 04/13/2014 1654   MCV 93.2 04/13/2014 1654   MCH 31.1 09/21/2013 0248   MCHC 33.1 04/13/2014 1654   RDW 15.1 04/13/2014 1654   LYMPHSABS 2.2 04/13/2014 1654   MONOABS 0.3 04/13/2014 1654   EOSABS 0.5 04/13/2014 1654   BASOSABS 0.0 04/13/2014 1654    BMET    Component Value Date/Time   NA 141 09/21/2013 0257   K 4.0 09/21/2013  0257   CL 107 09/21/2013 0257   CO2 21 08/17/2013 1408   GLUCOSE 105 (H) 09/21/2013 0257   BUN 11 09/21/2013 0257   CREATININE 0.90 09/21/2013 0257   CALCIUM 9.3 08/17/2013 1408   GFRNONAA >90 08/17/2013 1408   GFRAA >90 08/17/2013 1408    BNP No results found for: BNP  ProBNP    Component Value Date/Time   PROBNP 25.6 08/17/2013 1408    Imaging: No results found.   Assessment & Plan:   Asthma exacerbation  Pleasant 57 year old patient seen office today patient with asthma exacerbation today.  We will do short dose of prednisone for 5 days.  Patient to restart Symbicort 160.  Sample provided today.  Emphasized the importance the patient to follow-up with our office so we can see her at a stable interval for the last 3 years patient is only been here for urgent visit.  Discussed with patient need to start a daily antihistamine.  Could consider adding Singulair to help with control of Symbicort 160 does not help.  Attempted to get a Feno on patient today.  Patient was unable to perform the test.  Discussed extensively with patient as well as spouse that they need to follow-up with [facility].  Update their pharmacy with correct insurance.  Which it appears patient does not have supplemental plan.  They will need to attempt to get the medication from the pharmacy.  To see the total cost.  And to figure out their coverage.  If we need to change prescription or change based off of formulary for more than happy to do so.  Patient or spouse will need to contact our office and let us know what would be preferred.  Patient to follow-up with Dr. Kendrick FriesMcquaid in 6 weeks.  With pulmonary function test.  As we have attempted to get this completed multiple times.  Asthma, chronic Restart Symbicort 160 >>> 2 puffs in the morning right when you wake up, rinse out your mouth after use, 12 hours later 2 puffs, rinse after use >>> Take this daily, no matter what >>> This is not a rescue inhaler  >>>  Use your spacer >>>Sample provided today   Prednisone 10mg  tablet  >>> Take 2 tablets (20mg  total) daily for 5 days  Please follow-up with [facility] pharmacy regarding your rescue inhaler >>> Contact us if you need a new order placed  Only use your albuterol as a rescue medication to be used if you can't catch your breath by resting or doing a relaxed purse lip breathing pattern.  - The less you use it, the better it will work when you need it. - Ok to use up to 2 puffs  every 4 hours  if you must but call for immediate appointment if use goes up over your usual need - Don't leave home without it !!  (think of it like the spare tire for your car)   We will have you follow-up with our office in 4-6 weeks with Dr. Kendrick Fries with a pulmonary function test.   Allergic rhinitis  Can start daily antihistamine such as Zyrtec, Allegra, Claritin >>> Choose generic >>> Choose 1 >>> Take daily  Can continue Nasacort  We will have you follow-up with our office in 4-6 weeks with Dr. Carin Primrose, NP 08/29/2017

## 2017-08-29 ENCOUNTER — Ambulatory Visit (INDEPENDENT_AMBULATORY_CARE_PROVIDER_SITE_OTHER): Payer: Medicare Other | Admitting: Pulmonary Disease

## 2017-08-29 ENCOUNTER — Encounter: Payer: Self-pay | Admitting: Pulmonary Disease

## 2017-08-29 VITALS — BP 118/80 | HR 77 | Ht 63.0 in | Wt 224.0 lb

## 2017-08-29 DIAGNOSIS — R062 Wheezing: Secondary | ICD-10-CM

## 2017-08-29 DIAGNOSIS — J45909 Unspecified asthma, uncomplicated: Secondary | ICD-10-CM

## 2017-08-29 DIAGNOSIS — J309 Allergic rhinitis, unspecified: Secondary | ICD-10-CM

## 2017-08-29 MED ORDER — BUDESONIDE-FORMOTEROL FUMARATE 160-4.5 MCG/ACT IN AERO: 2.0000 | INHALATION_SPRAY | Freq: Two times a day (BID) | RESPIRATORY_TRACT | 6 refills | Status: DC

## 2017-08-29 MED ORDER — PREDNISONE 10 MG PO TABS: 10.0000 mg | ORAL_TABLET | Freq: Every day | ORAL | 0 refills | Status: DC

## 2017-08-29 NOTE — Patient Instructions (Addendum)
Restart Symbicort 160 >>> 2 puffs in the morning right when you wake up, rinse out your mouth after use, 12 hours later 2 puffs, rinse after use >>> Take this daily, no matter what >>> This is not a rescue inhaler  >>> Use your spacer >>>Sample provided today   Prednisone 10mg  tablet  >>> Take 2 tablets (20mg  total) daily for 5 days  Please follow-up with [facility] pharmacy regarding your rescue inhaler >>> Contact us if you need a new order placed  Only use your albuterol as a rescue medication to be used if you can't catch your breath by resting or doing a relaxed purse lip breathing pattern.  - The less you use it, the better it will work when you need it. - Ok to use up to 2 puffs  every 4 hours if you must but call for immediate appointment if use goes up over your usual need - Don't leave home without it !!  (think of it like the spare tire for your car)   Can start daily antihistamine such as Zyrtec, Allegra, Claritin >>> Choose generic >>> Choose 1 >>> Take daily  Can continue Nasacort  We will have you follow-up with our office in 4-6 weeks with Dr. Kendrick FriesMcQuaid with a pulmonary function test.   Please keep this appointment.  Follow-up with us if you are having difficulties obtaining her medications.    Please contact the office if your symptoms worsen or you have concerns that you are not improving.   Thank you for choosing [facility] for your healthcare, and for allowing us to partner with you on your healthcare journey. I am thankful to be able to provide care to you today.   Elisha HeadlandBrian Kierre Deines FNP-C

## 2017-08-29 NOTE — Assessment & Plan Note (Signed)
Restart Symbicort 160 >>> 2 puffs in the morning right when you wake up, rinse out your mouth after use, 12 hours later 2 puffs, rinse after use >>> Take this daily, no matter what >>> This is not a rescue inhaler  >>> Use your spacer >>>Sample provided today   Prednisone 10mg  tablet  >>> Take 2 tablets (20mg  total) daily for 5 days  Please follow-up with [facility] pharmacy regarding your rescue inhaler >>> Contact us if you need a new order placed  Only use your albuterol as a rescue medication to be used if you can't catch your breath by resting or doing a relaxed purse lip breathing pattern.  - The less you use it, the better it will work when you need it. - Ok to use up to 2 puffs  every 4 hours if you must but call for immediate appointment if use goes up over your usual need - Don't leave home without it !!  (think of it like the spare tire for your car)   We will have you follow-up with our office in 4-6 weeks with Dr. Kendrick FriesMcQuaid with a pulmonary function test.

## 2017-08-29 NOTE — Assessment & Plan Note (Signed)
  Can start daily antihistamine such as Zyrtec, Allegra, Claritin >>> Choose generic >>> Choose 1 >>> Take daily  Can continue Nasacort  We will have you follow-up with our office in 4-6 weeks with Dr. Kendrick FriesMcQuaid

## 2017-08-29 NOTE — Progress Notes (Signed)
Reviewed, agreed, needs follow up

## 2017-08-30 ENCOUNTER — Telehealth: Payer: Self-pay | Admitting: Pulmonary Disease

## 2017-08-30 NOTE — Telephone Encounter (Signed)
Called and spoke with pt regarding prednisone and albuterol inhaler questions. Pt was thinking the prednisone was rescue inhaler. Explained to pt and pt's husband difference b/w prednisone, and albuterol inhaler They both verbalized understanding. Pt questions why albuterol inhaler is $100 per pharmacy.  Called the pharmacy [facility] phone [phone] spoke with Sheron NightingaleMatt Matt advised that the albuterol inhaler is $100.00 due to pt's insurance is Medicare part A and B not D Asked Matt if pt was to have ProAir would it be less expensive for pt; it advised it would be $20 cheaper  Called pt back explained what pharmacist advised about the inhaler cost.  Pt agreed that she will be picking up both prednisone and inhaler albuterol today. Nothing further needed at this time.

## 2017-10-17 ENCOUNTER — Ambulatory Visit: Payer: Medicare Other | Admitting: Pulmonary Disease

## 2017-11-04 ENCOUNTER — Telehealth: Payer: Self-pay | Admitting: Pulmonary Disease

## 2017-11-04 MED ORDER — ALBUTEROL SULFATE HFA 108 (90 BASE) MCG/ACT IN AERS: 2.0000 | INHALATION_SPRAY | RESPIRATORY_TRACT | 5 refills | Status: DC | PRN

## 2017-11-04 NOTE — Telephone Encounter (Signed)
Spoke with pt's husband in the lobby. Pt is needing a sample of Proventil. Advised him that we do not have samples of this medication. He requests to have a prescription sent in. Rx has been sent in. Nothing further was needed.

## 2017-11-11 ENCOUNTER — Telehealth: Payer: Self-pay | Admitting: Pulmonary Disease

## 2017-11-11 DIAGNOSIS — I69320 Aphasia following cerebral infarction: Secondary | ICD-10-CM | POA: Diagnosis not present

## 2017-11-11 DIAGNOSIS — Z1211 Encounter for screening for malignant neoplasm of colon: Secondary | ICD-10-CM | POA: Diagnosis not present

## 2017-11-11 DIAGNOSIS — Z800 Family history of malignant neoplasm of digestive organs: Secondary | ICD-10-CM | POA: Diagnosis not present

## 2017-11-11 DIAGNOSIS — I69351 Hemiplegia and hemiparesis following cerebral infarction affecting right dominant side: Secondary | ICD-10-CM | POA: Diagnosis not present

## 2017-11-11 MED ORDER — BUDESONIDE-FORMOTEROL FUMARATE 160-4.5 MCG/ACT IN AERO: 2.0000 | INHALATION_SPRAY | Freq: Two times a day (BID) | RESPIRATORY_TRACT | 0 refills | Status: DC

## 2017-11-11 NOTE — Telephone Encounter (Signed)
Left message for Clide CliffRicky to call back. I have placed 2 samples of Symbicort 160 up front for pickup.

## 2017-11-12 NOTE — Telephone Encounter (Signed)
Made patient aware the samples are upfront nothing further needed at this time.

## 2017-12-16 DIAGNOSIS — K5730 Diverticulosis of large intestine without perforation or abscess without bleeding: Secondary | ICD-10-CM | POA: Diagnosis not present

## 2017-12-16 DIAGNOSIS — Z800 Family history of malignant neoplasm of digestive organs: Secondary | ICD-10-CM | POA: Diagnosis not present

## 2017-12-16 DIAGNOSIS — K640 First degree hemorrhoids: Secondary | ICD-10-CM | POA: Diagnosis not present

## 2017-12-20 ENCOUNTER — Other Ambulatory Visit: Payer: Self-pay | Admitting: Pulmonary Disease

## 2017-12-20 DIAGNOSIS — J45909 Unspecified asthma, uncomplicated: Secondary | ICD-10-CM

## 2017-12-23 ENCOUNTER — Encounter: Payer: Self-pay | Admitting: Pulmonary Disease

## 2017-12-23 ENCOUNTER — Ambulatory Visit (INDEPENDENT_AMBULATORY_CARE_PROVIDER_SITE_OTHER): Payer: Medicare Other | Admitting: Pulmonary Disease

## 2017-12-23 ENCOUNTER — Ambulatory Visit: Payer: Medicare Other | Admitting: Pulmonary Disease

## 2017-12-23 VITALS — BP 128/90 | HR 69 | Ht 63.0 in | Wt 225.0 lb

## 2017-12-23 DIAGNOSIS — J45909 Unspecified asthma, uncomplicated: Secondary | ICD-10-CM

## 2017-12-23 MED ORDER — ALBUTEROL SULFATE (2.5 MG/3ML) 0.083% IN NEBU: 2.5000 mg | INHALATION_SOLUTION | Freq: Four times a day (QID) | RESPIRATORY_TRACT | 11 refills | Status: DC | PRN

## 2017-12-23 MED ORDER — BUDESONIDE-FORMOTEROL FUMARATE 160-4.5 MCG/ACT IN AERO: 2.0000 | INHALATION_SPRAY | Freq: Two times a day (BID) | RESPIRATORY_TRACT | 6 refills | Status: DC

## 2017-12-23 MED ORDER — BUDESONIDE-FORMOTEROL FUMARATE 160-4.5 MCG/ACT IN AERO: 2.0000 | INHALATION_SPRAY | Freq: Two times a day (BID) | RESPIRATORY_TRACT | 5 refills | Status: DC

## 2017-12-23 NOTE — Patient Instructions (Signed)
Mild persistent asthma: Continue Symbicort 2 puffs twice a day Use albuterol as needed for chest tightness wheezing or shortness of breath: When you are outside the house you can use your HFA inhaler, however when you are around the house you can use the albuterol nebulizer that we prescribed today I am glad that your flu shot is up-to-date Practice good hand hygiene Stay active  Ask your primary care doc if you have already had the pneumonia vaccine  We will see you back in 4 to 6 months or sooner if needed

## 2017-12-23 NOTE — Progress Notes (Signed)
Subjective:    Patient ID: Andrea Townsend, female    DOB: [birth year], 57 y.o.   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 She was hospitalized in 2015 for wheezing with an associated elevated eosinophil count and IgE. She was discharged on prednisone.  HPI Chief Complaint  Patient presents with  . Follow-up    PFT attempted today but could not be completed, dry cough, shortness of breath, wheezing    Andrea Townsend had an exacerbation of asthma earlier this year and came to our office to reestablish care.  At that time Symbicort was prescribed again.  Since then she has not had problems with shortness of breath cough or wheezing.  Her symptoms have been stable.  She had difficulty trying to perform a lung function test today.  She does use albuterol from time to time but less than 1-2 times a month.  She had a flu shot already this year.  She thinks she had a pneumonia vaccine in the last 5 years.   Past Medical History:  Diagnosis Date  . Bronchitis   . Stroke [facility])       Review of Systems  Constitutional: Negative for chills, fatigue and fever.  HENT: Negative for nosebleeds, postnasal drip and rhinorrhea.   Respiratory: Positive for cough, shortness of breath and wheezing.   Cardiovascular: Negative for chest pain, palpitations and leg swelling.       Objective:   Physical Exam Vitals:   12/23/17 1526 12/23/17 1552  BP:  128/90  Pulse:  69  SpO2:  100%  Weight: 225 lb (102.1 kg)   Height: 5\' 3"  (1.6 m)   RA  Gen: well appearing HENT: OP clear, TM's clear, neck supple PULM: CTA B, normal percussion CV: RRR, no mgr, trace edema GI: BS+, soft, nontender Derm: no cyanosis or rash Neuro: R sided weakness, expressive aphasia   Recent visit with our nurse practitioner in July 2019 reviewed where she was treated for an asthma exacerbation and her Symbicort was restarted.       Assessment & Plan:   Chronic asthma without complication, unspecified asthma severity, unspecified whether persistent - Plan: Ambulatory Referral for DME  Discussion: Andrea Townsend has mild persistent asthma which is not well controlled using Symbicort.  She has not had an exacerbation since the last visit which is a good thing.  Her immunizations are up-to-date.  Plan: Mild persistent asthma: Continue Symbicort 2 puffs twice a day Use albuterol as needed for chest tightness wheezing or shortness of breath: When you are outside the house you can use your HFA inhaler, however when you are around the house you can use the albuterol nebulizer that we prescribed today I am glad that your flu shot is up-to-date Practice good hand hygiene Stay active  Ask your primary care doc if you have already had the pneumonia vaccine  We will see you back in 4 to 6 months or sooner if needed    Current Outpatient Medications:  .  albuterol (PROVENTIL HFA;VENTOLIN HFA) 108 (90 Base) MCG/ACT inhaler, Inhale 2 puffs into the lungs every 4 (four) hours as needed for wheezing or shortness of breath., Disp: 1 each, Rfl: 5 .  Biotin 1000 MCG tablet, Take 1,000 mcg by mouth 3 (three) times daily., Disp: , Rfl:  .  budesonide-formoterol (SYMBICORT) 160-4.5 MCG/ACT inhaler, Inhale 2 puffs into the lungs every 12 (twelve) hours., Disp: 1 Inhaler, Rfl:  5 .  Multiple Vitamin (MULTIVITAMIN WITH MINERALS) TABS tablet, Take 1 tablet by mouth daily., Disp: , Rfl:  .  omega-3 acid ethyl esters (LOVAZA) 1 G capsule, Take 1 capsule by mouth 2 (two) times daily., Disp: , Rfl:  .  Spacer/Aero-Holding Chambers (AEROCHAMBER MV) inhaler, Use as instructed, Disp: 1 each, Rfl: 0 .  albuterol (PROVENTIL) (2.5 MG/3ML) 0.083% nebulizer solution, Take 3 mLs (2.5 mg total) by nebulization every 6 (six) hours as needed for wheezing or shortness of breath., Disp: 75 mL, Rfl: 11 .  budesonide-formoterol (SYMBICORT) 160-4.5 MCG/ACT inhaler,  Inhale 2 puffs into the lungs 2 (two) times daily., Disp: 1 Inhaler, Rfl: 6

## 2018-04-01 ENCOUNTER — Telehealth: Payer: Self-pay | Admitting: Pulmonary Disease

## 2018-04-01 MED ORDER — BUDESONIDE-FORMOTEROL FUMARATE 160-4.5 MCG/ACT IN AERO: 2.0000 | INHALATION_SPRAY | Freq: Two times a day (BID) | RESPIRATORY_TRACT | 0 refills | Status: DC

## 2018-04-01 NOTE — Telephone Encounter (Signed)
Patient's husband came to office requesting Symbicort 160 sample. Sample provided. OV scheduled with Tammy P, NP 04/07/18.  Nothing further at this time.   Per BQ- 12/23/17- Mild persistent asthma: Continue Symbicort 2 puffs twice a day Use albuterol as needed for chest tightness wheezing or shortness of breath: When you are outside the house you can use your HFA inhaler, however when you are around the house you can use the albuterol nebulizer that we prescribed today I am glad that your flu shot is up-to-date Practice good hand hygiene Stay active We will see you back in 4 to 6 months or sooner if needed

## 2018-04-07 ENCOUNTER — Ambulatory Visit (INDEPENDENT_AMBULATORY_CARE_PROVIDER_SITE_OTHER): Payer: Medicare Other | Admitting: Adult Health

## 2018-04-07 ENCOUNTER — Encounter: Payer: Self-pay | Admitting: Adult Health

## 2018-04-07 DIAGNOSIS — J309 Allergic rhinitis, unspecified: Secondary | ICD-10-CM

## 2018-04-07 DIAGNOSIS — J45909 Unspecified asthma, uncomplicated: Secondary | ICD-10-CM

## 2018-04-07 NOTE — Progress Notes (Signed)
@Patient  ID: Andrea Townsend, female    DOB: [birth year], 58 y.o.   MRN: [id number]  Chief Complaint  Patient presents with  . Follow-up    Asthma    Referring provider: Lorenda Ishihara  HPI: 58 year old female former smoker followed for asthma Medical history significant for CVA with aphasia and right-sided hemiparalysis.  Elevated IgE and eosinophil   TEST/EVENTS :   04/07/2018 Follow up : Asthma  Patient returns for a 56-month follow-up.  Patient has mild persistent asthma.  She remains on Symbicort.  Patient is requesting samples.  She is accompanied by her husband.  Unfortunately they do not have prescription coverage.  She has Medicare a and B.  We discussed that Medicare D which is his prescription plan supplement may be necessary.  Her Symbicort is not covered to due to this.  We discussed alternatives such as nebulizers which they would like to pursue. She says overall breathing has been doing okay.  She denies any flare of cough or wheezing.  No increased albuterol use. Does complain of nasal congestion and drainage.  Clear in nature worse at night  No Known Allergies  Immunization History  Administered Date(s) Administered  . Influenza,inj,Quad PF,6+ Mos 11/10/2014, 11/19/2017    Past Medical History:  Diagnosis Date  . Bronchitis   . Stroke [facility])     Tobacco History: Social History   Tobacco Use  Smoking Status Former Smoker  . Packs/day: 1.00  . Years: 20.00  . Pack years: 20.00  . Types: Cigarettes  . Last attempt to quit: 09/22/2006  . Years since quitting: 11.5  Smokeless Tobacco Never Used   Counseling given: Not Answered   Outpatient Medications Prior to Visit  Medication Sig Dispense Refill  . albuterol (PROVENTIL HFA;VENTOLIN HFA) 108 (90 Base) MCG/ACT inhaler Inhale 2 puffs into the lungs every 4 (four) hours as needed for wheezing or shortness of breath. 1 each 5  . albuterol (PROVENTIL) (2.5 MG/3ML) 0.083% nebulizer solution Take  3 mLs (2.5 mg total) by nebulization every 6 (six) hours as needed for wheezing or shortness of breath. 75 mL 11  . Biotin 1000 MCG tablet Take 1,000 mcg by mouth 3 (three) times daily.    . budesonide-formoterol (SYMBICORT) 160-4.5 MCG/ACT inhaler Inhale 2 puffs into the lungs every 12 (twelve) hours. 1 Inhaler 5  . Multiple Vitamin (MULTIVITAMIN WITH MINERALS) TABS tablet Take 1 tablet by mouth daily.    Marland Kitchen omega-3 acid ethyl esters (LOVAZA) 1 G capsule Take 1 capsule by mouth 2 (two) times daily.    Marland Kitchen Spacer/Aero-Holding Chambers (AEROCHAMBER MV) inhaler Use as instructed 1 each 0  . budesonide-formoterol (SYMBICORT) 160-4.5 MCG/ACT inhaler Inhale 2 puffs into the lungs 2 (two) times daily. 1 Inhaler 6  . budesonide-formoterol (SYMBICORT) 160-4.5 MCG/ACT inhaler Inhale 2 puffs into the lungs 2 (two) times daily. 1 Inhaler 0   No facility-administered medications prior to visit.      Review of Systems:   Constitutional:   No  weight loss, night sweats,  Fevers, chills, fatigue, or  lassitude.  HEENT:   No headaches,  Difficulty swallowing,  Tooth/dental problems, or  Sore throat,                No sneezing, itching, ear ache, + nasal congestion, post nasal drip,   CV:  No chest pain,  Orthopnea, PND, swelling in lower extremities, anasarca, dizziness, palpitations, syncope.   GI  No heartburn, indigestion, abdominal pain, nausea, vomiting, diarrhea, change in bowel  habits, loss of appetite, bloody stools.   Resp: No shortness of breath with exertion or at rest.  No excess mucus, no productive cough,  No non-productive cough,  No coughing up of blood.  No change in color of mucus.  No wheezing.  No chest wall deformity  Skin: no rash or lesions.  GU: no dysuria, change in color of urine, no urgency or frequency.  No flank pain, no hematuria   MS: Chronic right-sided weakness/Heema paralysis   Physical Exam  BP 116/68 (BP Location: Left Arm, Cuff Size: Normal)   Pulse 89   Ht 5'  3" (1.6 m)   Wt 225 lb (102.1 kg)   SpO2 98%   BMI 39.86 kg/m   GEN: A/Ox3; pleasant , NAD, well nourished , wheelchair-bound  HEENT:  [place]/AT,  EACs-clear, TMs-wnl, NOSE-clear, THROAT-clear, no lesions, no postnasal drip or exudate noted.   NECK:  Supple w/ fair ROM; no JVD; normal carotid impulses w/o bruits; no thyromegaly or nodules palpated; no lymphadenopathy.    RESP  Clear  P & A; w/o, wheezes/ rales/ or rhonchi. no accessory muscle use, no dullness to percussion  CARD:  RRR, no m/r/g, trace peripheral edema, pulses intact, no cyanosis or clubbing.  GI:   Soft & nt; nml bowel sounds; no organomegaly or masses detected.   Musco: Warm bil, no deformities or joint swelling noted.   Neuro: alert, no focal deficits noted.  Right-sided hemo-paresis/paralysis  Skin: Warm, no lesions or rashes    Lab Results:  CBC  BNP No results found for: BNP   Imaging: No results found.    No flowsheet data found.  No results found for: NITRICOXIDE      Assessment & Plan:   Asthma, chronic Mild persistent asthma.  We discussed the importance of medication compliance and maintenance.  Samples were given of Symbicort.  We will try to change over to budesonide and Brovana to help with cost.  If this is not financially acceptable.  Will fill out patient assistance paperwork for her. A good Rx card will be given to patient to help with any future prescriptions as well Adding Claritin and Flonase to help with trigger prevention  Plan  Patient Instructions  Continue on Symbicort 2 puffs Twice daily  , rinse after use.  Samples given today .  Will send order for Budesonide and Brovana Neb Twice daily  To take place of Symbicort if covered by your insurance , if covered you will stop symbicort and use this.  In the fall look at options for adding prescription coverage for your plan or look option with your husbands insurance plan .  Add Claritin 10mg  daily As needed  Drainage  Add  Flonase 2 puffs daily As needed  Drainage  Follow up 3-4 months with Dr. Kendrick Fries or Marikay Roads NP and As needed        Allergic rhinitis Mild flare  adding Claritin and Flonase     Rubye Oaks, NP 04/07/2018

## 2018-04-07 NOTE — Assessment & Plan Note (Signed)
Mild flare  adding Claritin and Flonase

## 2018-04-07 NOTE — Assessment & Plan Note (Signed)
Mild persistent asthma.  We discussed the importance of medication compliance and maintenance.  Samples were given of Symbicort.  We will try to change over to budesonide and Brovana to help with cost.  If this is not financially acceptable.  Will fill out patient assistance paperwork for her. A good Rx card will be given to patient to help with any future prescriptions as well Adding Claritin and Flonase to help with trigger prevention  Plan  Patient Instructions  Continue on Symbicort 2 puffs Twice daily  , rinse after use.  Samples given today .  Will send order for Budesonide and Brovana Neb Twice daily  To take place of Symbicort if covered by your insurance , if covered you will stop symbicort and use this.  In the fall look at options for adding prescription coverage for your plan or look option with your husbands insurance plan .  Add Claritin 10mg  daily As needed  Drainage  Add Flonase 2 puffs daily As needed  Drainage  Follow up 3-4 months with Dr. Kendrick Fries or Warnell Rasnic NP and As needed

## 2018-04-07 NOTE — Patient Instructions (Addendum)
Continue on Symbicort 2 puffs Twice daily  , rinse after use.  Samples given today .  Will send order for Budesonide and Brovana Neb Twice daily  To take place of Symbicort if covered by your insurance , if covered you will stop symbicort and use this.  In the fall look at options for adding prescription coverage for your plan or look option with your husbands insurance plan .  Add Claritin 10mg  daily As needed  Drainage  Add Flonase 2 puffs daily As needed  Drainage  Follow up 3-4 months with Dr. Kendrick Fries or Yennifer Segovia NP and As needed

## 2018-04-08 MED ORDER — ARFORMOTEROL TARTRATE 15 MCG/2ML IN NEBU: 15.0000 ug | INHALATION_SOLUTION | Freq: Two times a day (BID) | RESPIRATORY_TRACT | 5 refills | Status: DC

## 2018-04-08 MED ORDER — BUDESONIDE 0.25 MG/2ML IN SUSP: 0.2500 mg | Freq: Two times a day (BID) | RESPIRATORY_TRACT | 5 refills | Status: DC

## 2018-04-08 MED ORDER — BUDESONIDE-FORMOTEROL FUMARATE 160-4.5 MCG/ACT IN AERO: 2.0000 | INHALATION_SPRAY | Freq: Two times a day (BID) | RESPIRATORY_TRACT | 0 refills | Status: DC

## 2018-04-08 NOTE — Addendum Note (Signed)
Addended by: Boone Master E on: 04/08/2018 08:56 AM   Modules accepted: Orders

## 2018-04-09 NOTE — Progress Notes (Signed)
Reviewed, agree 

## 2018-04-15 ENCOUNTER — Telehealth: Payer: Self-pay | Admitting: Adult Health

## 2018-04-15 NOTE — Telephone Encounter (Signed)
Per TP: okay, noted.  Thank you.

## 2018-04-15 NOTE — Telephone Encounter (Signed)
Called and spoke with Jaclyn Shaggy.  She stated that they received a call from Patients Husband.  He stated that they did not want to rent a neb machine, did not want neb medication, or anything else from them at this time.  Message routed to Dayton Bailiff, NP as Lorain Childes

## 2018-05-05 DIAGNOSIS — I10 Essential (primary) hypertension: Secondary | ICD-10-CM | POA: Diagnosis not present

## 2018-05-05 DIAGNOSIS — J301 Allergic rhinitis due to pollen: Secondary | ICD-10-CM | POA: Diagnosis not present

## 2018-05-05 DIAGNOSIS — L989 Disorder of the skin and subcutaneous tissue, unspecified: Secondary | ICD-10-CM | POA: Diagnosis not present

## 2018-05-05 DIAGNOSIS — I69351 Hemiplegia and hemiparesis following cerebral infarction affecting right dominant side: Secondary | ICD-10-CM | POA: Diagnosis not present

## 2018-06-10 ENCOUNTER — Telehealth: Payer: Self-pay | Admitting: Pulmonary Disease

## 2018-06-10 NOTE — Telephone Encounter (Signed)
ATC Andrea Townsend.but there no answer. Left message for him  to call back.

## 2018-06-11 ENCOUNTER — Other Ambulatory Visit: Payer: Self-pay | Admitting: Pulmonary Disease

## 2018-06-11 MED ORDER — BUDESONIDE-FORMOTEROL FUMARATE 160-4.5 MCG/ACT IN AERO: 2.0000 | INHALATION_SPRAY | Freq: Two times a day (BID) | RESPIRATORY_TRACT | 0 refills | Status: DC

## 2018-06-11 NOTE — Telephone Encounter (Signed)
Spoke with patient's husband. He stated that the patient has been having a hard time affording her Symbicort 160. She has been out for several days. He was requesting samples. Advised husband that normally we are not allowing to give out samples due to COVID-19 but since she was out, I would get 2 for her. Will also print out patient assistance forms for her.   He verbalized understanding. Stated that he would meet me at the front door around [phone] to get the samples.   Nothing further needed at time of call.

## 2018-06-16 ENCOUNTER — Telehealth: Payer: Self-pay | Admitting: Pulmonary Disease

## 2018-06-16 MED ORDER — PREDNISONE 10 MG PO TABS: ORAL_TABLET | ORAL | 0 refills | Status: DC

## 2018-06-16 NOTE — Telephone Encounter (Signed)
Will send in prednisone taper

## 2018-06-16 NOTE — Telephone Encounter (Signed)
Called patient spouse Andrea Townsend) and advised him prednisone was being sent to the pharmacy. He confirmed his wife was out of the mucinex. He asked if he should go ahead and pick that up. I told him it would be good to have that at home, but to confirm with pharmacist as to how to take it since she will be on prednisone.  Advised that if her breathing does not improve after taking the prednisone to call our office and let us know.   He voiced understanding, nothing further needed at this time.

## 2018-06-16 NOTE — Telephone Encounter (Signed)
Called the patient's husband Clide Cliff) and advised of recommendations. He stated she has been wheezing, and was this morning before he left the house.   He confirmed the patient used her albuterol this morning. She has been using mucinex off and on for at least the last couple weeks. Not sure if she is out of if or not.  Advised him the updated information would be forwarded back to the app of day for recommendations. And he would be called back.   Beth, based on his update, can a nebulizer machine with neb solution be ordered?

## 2018-06-16 NOTE — Telephone Encounter (Signed)
Advise she start taking Claritin daily, mucinex twice a day. Continue Symbicort twice a day, use rescue inhaler as needed every 4-6 hours. If she develops any shortness of breath or wheezing please call back.

## 2018-06-16 NOTE — Telephone Encounter (Signed)
Called and spoke with patient husband who became upset because I was asking to speak to the patient. And asked his name. Stated I was being rude to him because he is the one who made the call for the patient because she cannot speak.   I explained to him that I was not being rude, I was asking the questions because the notation made on the call was that the patient was the one calling and not him. And I needed to verify who I was talking to because I was calling from a doctors office.  He stated the patient has this problem this time of year because of her allergies. The cough is constant, and usually productive with clear congestion/mucus. Denied her having any shortness of breath, fever or wheezing.  The patient has used her Symbicort and Albuterol. But has not had a nebulizer treatment in about a year. The patient does not have a nebulizer machine. Patient has not used any OTC's for the clear congestion/mucus.  Patient last seen in clinic 04/07/18 due to chronic asthma.  Message forwarded to app of the day, Buelah Manis, NP to advise.   Beth, can you please take a look at the information and give recommendations? She has not tried any OTC's. Does she need nebulizer treatment?

## 2018-07-09 ENCOUNTER — Ambulatory Visit: Payer: Medicare Other | Admitting: Pulmonary Disease

## 2018-08-06 ENCOUNTER — Telehealth: Payer: Self-pay

## 2018-08-06 DIAGNOSIS — L820 Inflamed seborrheic keratosis: Secondary | ICD-10-CM | POA: Diagnosis not present

## 2018-08-06 NOTE — Telephone Encounter (Signed)
Patient husband stopped by the office to drop off [employer] patient assistance application. Made aware we would place this paperwork in the providers box and we would contact them once the paperwork has been signed and faxed.

## 2018-08-06 NOTE — Telephone Encounter (Signed)
Routed to [employer] for follow up.

## 2018-08-07 NOTE — Telephone Encounter (Signed)
Paperwork has been completed and will be faxed back to [place] today.   Spoke with patient's husband, he is aware that I will fax the forms today. Verbalized understanding about process.   Nothing further needed at time of call.

## 2018-08-15 DIAGNOSIS — I69320 Aphasia following cerebral infarction: Secondary | ICD-10-CM | POA: Diagnosis not present

## 2018-08-15 DIAGNOSIS — N3941 Urge incontinence: Secondary | ICD-10-CM | POA: Diagnosis not present

## 2018-08-15 DIAGNOSIS — Z1211 Encounter for screening for malignant neoplasm of colon: Secondary | ICD-10-CM | POA: Diagnosis not present

## 2018-08-15 DIAGNOSIS — Z23 Encounter for immunization: Secondary | ICD-10-CM | POA: Diagnosis not present

## 2018-08-15 DIAGNOSIS — Z1239 Encounter for other screening for malignant neoplasm of breast: Secondary | ICD-10-CM | POA: Diagnosis not present

## 2018-08-15 DIAGNOSIS — I10 Essential (primary) hypertension: Secondary | ICD-10-CM | POA: Diagnosis not present

## 2018-08-15 DIAGNOSIS — I69351 Hemiplegia and hemiparesis following cerebral infarction affecting right dominant side: Secondary | ICD-10-CM | POA: Diagnosis not present

## 2018-08-15 DIAGNOSIS — Z1389 Encounter for screening for other disorder: Secondary | ICD-10-CM | POA: Diagnosis not present

## 2018-08-15 DIAGNOSIS — Z0000 Encounter for general adult medical examination without abnormal findings: Secondary | ICD-10-CM | POA: Diagnosis not present

## 2018-08-15 DIAGNOSIS — J301 Allergic rhinitis due to pollen: Secondary | ICD-10-CM | POA: Diagnosis not present

## 2018-08-18 ENCOUNTER — Telehealth: Payer: Self-pay | Admitting: Pulmonary Disease

## 2018-08-18 DIAGNOSIS — R062 Wheezing: Secondary | ICD-10-CM

## 2018-08-18 DIAGNOSIS — J45909 Unspecified asthma, uncomplicated: Secondary | ICD-10-CM

## 2018-08-18 MED ORDER — BUDESONIDE-FORMOTEROL FUMARATE 160-4.5 MCG/ACT IN AERO: 2.0000 | INHALATION_SPRAY | Freq: Two times a day (BID) | RESPIRATORY_TRACT | 0 refills | Status: DC

## 2018-08-18 NOTE — Telephone Encounter (Signed)
Called spoke with patient husband. Advised him the samples would be at the front desk for pick up. Nothing further needed at this time.

## 2018-09-11 ENCOUNTER — Ambulatory Visit (INDEPENDENT_AMBULATORY_CARE_PROVIDER_SITE_OTHER): Payer: Medicare Other | Admitting: Nurse Practitioner

## 2018-09-11 ENCOUNTER — Telehealth: Payer: Self-pay | Admitting: Nurse Practitioner

## 2018-09-11 ENCOUNTER — Encounter: Payer: Self-pay | Admitting: Nurse Practitioner

## 2018-09-11 ENCOUNTER — Telehealth: Payer: Self-pay | Admitting: Adult Health

## 2018-09-11 ENCOUNTER — Other Ambulatory Visit: Payer: Self-pay

## 2018-09-11 VITALS — BP 124/86 | HR 81 | Temp 98.3°F | Ht 63.0 in | Wt 228.0 lb

## 2018-09-11 DIAGNOSIS — J45901 Unspecified asthma with (acute) exacerbation: Secondary | ICD-10-CM | POA: Insufficient documentation

## 2018-09-11 MED ORDER — METHYLPREDNISOLONE ACETATE 80 MG/ML IJ SUSP
80.0000 mg | Freq: Once | INTRAMUSCULAR | Status: AC
  Administered 2018-09-11: 80 mg via INTRAMUSCULAR

## 2018-09-11 NOTE — Telephone Encounter (Signed)
ATC Patient/Patient's Husband Andrea Townsend.  Left message on VM to return call.

## 2018-09-11 NOTE — Telephone Encounter (Signed)
Pt's husband Audry Pili is calling back - thought he missed a call again.

## 2018-09-11 NOTE — Telephone Encounter (Signed)
ATC Patient's Husband X's 3.  LMTCB.  ATC other number listed.  Patient answered phone and hung up.  ATC number back and call went straight to VM, LMTCB.

## 2018-09-11 NOTE — Telephone Encounter (Signed)
Patient is scheduled with Tonya,NP 09/11/18, at 4:30pm.   Maxwell Caul and spoke with [facility].  Gilda stated [facility] was waiting on neb medication to be faxed.  Explained Patient has OV with Tonya, NP, and meds will be determined at [place].  Understanding stated.  Nothing further at this time.

## 2018-09-11 NOTE — Progress Notes (Signed)
 @Patient  ID: Andrea Townsend, female    DOB: 1960/07/10, 58 y.o.   MRN: [id number]  Chief Complaint  Patient presents with  . Shortness of Breath    with asthma and cough    Referring provider: Lorenda IshiharaVaradarajan, Rupashree,*  HPI 58 year old female former smoker with asthma who is followed by Dr. Kendrick FriesMcQuaid. PMH: CVA with aphasia and right-sided hemi-paralysis.  Elevated IgE and eosinophils.  OV 09/11/18 - Acute asthma Patient presents today for an asthma exacerbation.  She has previously been evaluated here for asthma and now presents with an asthma exacerbation. This exacerbation began 1 week ago.  Associated symptoms include dyspnea, nonproductive cough and wheezing.  Suspected precipitants include recent hot humid weather.  Symptoms have been gradually worsening since their onset.  This is the first evaluation that has occurred during this exacerbation. She has treated this current exacerbation with short-acting inhaled beta-adrenergic agonists and symbicort and albuterol. The patient reports adherence to this regimen. Denies f/c/s, n/v/d, hemoptysis, PND, leg swelling.     No Known Allergies  Immunization History  Administered Date(s) Administered  . Influenza,inj,Quad PF,6+ Mos 11/10/2014, 11/19/2017    Past Medical History:  Diagnosis Date  . Bronchitis   . Stroke [facility])     Tobacco History: Social History   Tobacco Use  Smoking Status Former Smoker  . Packs/day: 1.00  . Years: 20.00  . Pack years: 20.00  . Types: Cigarettes  . Quit date: 09/22/2006  . Years since quitting: 11.9  Smokeless Tobacco Never Used   Counseling given: Not Answered   Outpatient Encounter Medications as of 09/11/2018  Medication Sig  . albuterol (PROVENTIL) (2.5 MG/3ML) 0.083% nebulizer solution Take 3 mLs (2.5 mg total) by nebulization every 6 (six) hours as needed for wheezing or shortness of breath.  Marland Kitchen. albuterol (VENTOLIN HFA) 108 (90 Base) MCG/ACT inhaler INHALE 2 PUFFS BY MOUTH EVERY 4  HOURS AS NEEDED FOR WHEEZING FOR SHORTNESS OF BREATH  . arformoterol (BROVANA) 15 MCG/2ML NEBU Take 2 mLs (15 mcg total) by nebulization 2 (two) times daily. Dx: J45.909  . Biotin 1000 MCG tablet Take 1,000 mcg by mouth 3 (three) times daily.  . budesonide (PULMICORT) 0.25 MG/2ML nebulizer solution Take 2 mLs (0.25 mg total) by nebulization 2 (two) times daily. Dx: J45.909  . budesonide-formoterol (SYMBICORT) 160-4.5 MCG/ACT inhaler Inhale 2 puffs into the lungs every 12 (twelve) hours.  . budesonide-formoterol (SYMBICORT) 160-4.5 MCG/ACT inhaler Inhale 2 puffs into the lungs 2 (two) times daily.  . budesonide-formoterol (SYMBICORT) 160-4.5 MCG/ACT inhaler Inhale 2 puffs into the lungs 2 (two) times daily.  . budesonide-formoterol (SYMBICORT) 160-4.5 MCG/ACT inhaler Inhale 2 puffs into the lungs 2 (two) times a day.  . lisinopril (ZESTRIL) 10 MG tablet Take 1 tablet by mouth daily.  Marland Kitchen. loratadine (CLARITIN) 10 MG tablet Take 10 mg by mouth daily.  . Multiple Vitamin (MULTIVITAMIN WITH MINERALS) TABS tablet Take 1 tablet by mouth daily.  Marland Kitchen. omega-3 acid ethyl esters (LOVAZA) 1 G capsule Take 1 capsule by mouth 2 (two) times daily.  . predniSONE (DELTASONE) 10 MG tablet Take 4 tabs po daily x 3 days; then 3 tabs daily x3 days; then 2 tabs daily x3 days; then 1 tab daily x 3 days; then stop  . Spacer/Aero-Holding Chambers (AEROCHAMBER MV) inhaler Use as instructed   Facility-Administered Encounter Medications as of 09/11/2018  Medication  . methylPREDNISolone acetate (DEPO-MEDROL) injection 80 mg     Review of Systems  Review of Systems  Constitutional: Negative.  Negative  for chills and fever.  HENT: Negative.   Respiratory: Positive for cough, shortness of breath and wheezing.   Cardiovascular: Negative.  Negative for chest pain, palpitations and leg swelling.  Gastrointestinal: Negative.   Allergic/Immunologic: Negative.   Neurological: Negative.   Psychiatric/Behavioral: Negative.         Physical Exam  BP 124/86 (BP Location: Right Arm, Patient Position: Sitting, Cuff Size: Normal)   Pulse 81   Temp 98.3 F (36.8 C)   Ht 5\' 3"  (1.6 m)   Wt 228 lb (103.4 kg)   SpO2 96%   BMI 40.39 kg/m   Wt Readings from Last 5 Encounters:  09/11/18 228 lb (103.4 kg)  04/07/18 225 lb (102.1 kg)  12/23/17 225 lb (102.1 kg)  08/29/17 224 lb (101.6 kg)  05/03/15 224 lb 3.2 oz (101.7 kg)     Physical Exam Vitals signs and nursing note reviewed.  Constitutional:      General: She is not in acute distress.    Appearance: She is well-developed.  Cardiovascular:     Rate and Rhythm: Normal rate and regular rhythm.  Pulmonary:     Effort: Pulmonary effort is normal.     Breath sounds: Normal breath sounds. No decreased breath sounds, wheezing or rhonchi.  Musculoskeletal:     Right lower leg: No edema.  Neurological:     Mental Status: She is alert and oriented to person, place, and time.      Assessment & Plan:   Exacerbation of asthma Patient presents today with asthma exacerbation which started 1 week ago.  She states symptoms are progressively worsening.  She complains of wheezing shortness of breath and nonproductive cough.  Denies any recent fever.  Patient Instructions  Continue Symbicort Continue Proventil Depo medrol injection given in office today Will order prednisone taper to start tomorrow Will order azithromycin  Follow up: Follow up with Dr. Lake Bells in 3-4 months or sooner if symptoms persist or worsen          Fenton Foy, NP 09/11/2018

## 2018-09-11 NOTE — Telephone Encounter (Signed)
Called and spoke with Patient's husband, Andrea Townsend. Andrea Townsend stated Patient has difficulty speaking, and can only answer yes or no. Andrea Townsend stated patient has chronic asthma, and he believes she is having a flare.  Andrea Townsend stated last time she had a flare, Patient received a neb treatment and that helped.  Per medication list, albuterol nebs and brovana nebs are listed, but Andrea Townsend stated, they never received a neb machine. Andrea Townsend stated he was unsure if it was due to insurance.  04/08/18, neb machine was ordered by TP, through [place]. Andrea Townsend stated patient has had increased sob, a non productive cough, with wheezing.  Andrea Townsend stated, Patient has not had fever, chills, loss of smell, taste, or covid exposures. Andrea Townsend stated Patient has stayed home.  [name] stated TP and BQ prescribed prednisone tapers when Patient had flares in the past.  Telephone visit/my chart visits will not work, because Patient has difficulty speaking.  Andrea Townsend wants her seen in office. OV scheduled 09/11/18, at 4:30pm, with Kenney Houseman, NP. Nothing further at this time.

## 2018-09-11 NOTE — Assessment & Plan Note (Addendum)
Patient presents today with asthma exacerbation which started 1 week ago.  She states symptoms are progressively worsening.  She complains of wheezing shortness of breath and nonproductive cough.  Denies any recent fever.  Patient Instructions  Continue Symbicort Continue Proventil Depo medrol injection given in office today Will order prednisone taper to start tomorrow Will order azithromycin  Follow up: Follow up with Dr. Lake Bells in 3-4 months or sooner if symptoms persist or worsen

## 2018-09-11 NOTE — Patient Instructions (Signed)
Continue Symbicort Continue Proventil Depo medrol injection given in office today Will order prednisone taper to start tomorrow Will order azithromycin  Follow up: Follow up with Dr. Lake Bells in 3-4 months or sooner if symptoms persist or worsen

## 2018-09-12 ENCOUNTER — Other Ambulatory Visit: Payer: Self-pay | Admitting: General Surgery

## 2018-09-12 DIAGNOSIS — J45909 Unspecified asthma, uncomplicated: Secondary | ICD-10-CM

## 2018-09-12 MED ORDER — BUDESONIDE 0.25 MG/2ML IN SUSP: 0.2500 mg | Freq: Two times a day (BID) | RESPIRATORY_TRACT | 11 refills | Status: DC

## 2018-09-12 MED ORDER — ARFORMOTEROL TARTRATE 15 MCG/2ML IN NEBU: 15.0000 ug | INHALATION_SOLUTION | Freq: Two times a day (BID) | RESPIRATORY_TRACT | 11 refills | Status: DC

## 2018-09-12 NOTE — Progress Notes (Signed)
Reviewed, agree 

## 2018-09-15 ENCOUNTER — Telehealth: Payer: Self-pay | Admitting: Nurse Practitioner

## 2018-09-15 NOTE — Telephone Encounter (Signed)
Checked pt's medlist and saw that brovana and pulmicort neb sol are both on pt's current med list. I see that the Rx has a status of print instead of normal.  Hinton Dyer, please advise if you meant to have status as print vs normal and if so, where were the Rx sent to?

## 2018-09-15 NOTE — Telephone Encounter (Signed)
LVM for patient husband Audry Pili) let him know his message was received and based on wife's OV last week she can continue the Symbicort, we will see what we can find out regarding the neb solution and let him know what we can find out.    Note: the nebulizer machine was ordered February 2020. However, based on the cost patient spouse declined delivery of the machine. Based on conversation with [facility] last week the order could still be processed they only needed the fax prescription for neb solution which was sent.

## 2018-09-15 NOTE — Telephone Encounter (Signed)
She can continue the Symbicort if the they cannot afford the nebulizer. I will run this by our in house pharmacists this week to see if they have any other suggestions.

## 2018-09-15 NOTE — Telephone Encounter (Signed)
The prescriptions were printed and faxed to [facility] on 09/12/18 to [phone] fax confirmation was received.   Called [facility] and was told they contacted the patient spouse Friday and he told them they were only supposed to get the [place] only and not the Pulmicort. He also told them he was told in office the cost of the nebulizer solution would be $10.00.   I told the rep Estill Bamberg ?) I specifically talked to him about the rental cost of the nebulizer machine only. We did not discuss the cost of medication because there is no way we would be able to know that information. I told him during the OV that the cost of the medication we do not have control over and it would be base on insurance. [facility] quoted a price of $135.36 for the Brovana and Pulmicort.  I asked if he accepted delivery of the nebulizer machine and Estill Bamberg was told by the patient's husband that he would call our office first to find out about the medication before accepting delivery of the nebulizer machine.  Above routed to Lazaro Arms, FNP-C who saw the patient and issued prescription last week.  Tonya, please take a look at the above and advise. Thank you.

## 2018-09-16 NOTE — Telephone Encounter (Signed)
Reviewed AVS:   " Continue Symbicort Continue Proventil Depo medrol injection given in office today Will order prednisone taper to start tomorrow Will order azithromycin  Follow up: Follow up with Dr. Lake Bells in 3-4 months or sooner if symptoms persist or worsen"  Tonya, please advise which taper you would like for [place] to send in as well as the length of need for the zpak. Thanks!

## 2018-09-16 NOTE — Telephone Encounter (Signed)
Attempted to call pt's husband Audry Pili but unable to reach. Left message for Audry Pili to return call.

## 2018-09-16 NOTE — Telephone Encounter (Signed)
Left message for Audry Pili to call back.

## 2018-09-16 NOTE — Telephone Encounter (Signed)
Patient husband returning phone call.  Patient phone number is [phone].

## 2018-09-16 NOTE — Telephone Encounter (Signed)
Pt calling again say that he thought that some pills were to have been called in for this pt said he checked @ the pharm and nothing was there said that he may have misunderstood a/b that needing clarity.Hillery Hunter

## 2018-09-16 NOTE — Telephone Encounter (Signed)
LMTCB x1 for pt's husband.  °

## 2018-09-16 NOTE — Telephone Encounter (Signed)
Andrea Townsend calling back on behalf of Andrea Townsend a/b her medication he can be reached @ [phone].Hillery Hunter

## 2018-09-16 NOTE — Telephone Encounter (Signed)
Spoke with [employer]. Advised him that [place] had stated for the patient to resume her Symbicort until we can have a medication that is affordable for her. He verbalized understanding and stated that he will discuss the medication change with his wife.

## 2018-09-17 ENCOUNTER — Telehealth: Payer: Self-pay

## 2018-09-17 DIAGNOSIS — J45909 Unspecified asthma, uncomplicated: Secondary | ICD-10-CM

## 2018-09-17 DIAGNOSIS — L304 Erythema intertrigo: Secondary | ICD-10-CM | POA: Diagnosis not present

## 2018-09-17 DIAGNOSIS — L820 Inflamed seborrheic keratosis: Secondary | ICD-10-CM | POA: Diagnosis not present

## 2018-09-17 MED ORDER — PREDNISONE 10 MG PO TABS: ORAL_TABLET | ORAL | 0 refills | Status: DC

## 2018-09-17 MED ORDER — AZITHROMYCIN 250 MG PO TABS: ORAL_TABLET | ORAL | 0 refills | Status: DC

## 2018-09-17 NOTE — Telephone Encounter (Signed)
Pt husband is calling back and stated that now the patient is having trouble breathing and spouse believes it could be because she has not had her medication. CB is [phone]

## 2018-09-17 NOTE — Telephone Encounter (Signed)
Could you please check into this for [place]. Thanks.

## 2018-09-17 NOTE — Telephone Encounter (Signed)
Requested call back 7/30 to talk about information regarding the prescription program application.

## 2018-09-17 NOTE — Telephone Encounter (Signed)
I sent it through to the pharmacy. Sorry, I thought it was already sent. Thanks.

## 2018-09-17 NOTE — Telephone Encounter (Signed)
Kenney Houseman - can you please advise on what type of prednisone taper you want the pt on as well as the [place]? Thanks.

## 2018-09-17 NOTE — Telephone Encounter (Signed)
Called and spoke to pt's husband, Andrea Townsend. Informed him of the azithromycin and prednisone was sent to the [facility] on [address]. [name] verbalized understanding.   Tonya/Dana please advise if there is any update with pt's neb meds vs other options advised by the pharmacy team. Thanks.

## 2018-09-18 MED ORDER — ALBUTEROL SULFATE HFA 108 (90 BASE) MCG/ACT IN AERS: 2.0000 | INHALATION_SPRAY | RESPIRATORY_TRACT | 3 refills | Status: AC | PRN

## 2018-09-18 MED ORDER — BUDESONIDE-FORMOTEROL FUMARATE 160-4.5 MCG/ACT IN AERO: 2.0000 | INHALATION_SPRAY | Freq: Two times a day (BID) | RESPIRATORY_TRACT | 6 refills | Status: DC

## 2018-09-18 NOTE — Progress Notes (Signed)
Called patient to discuss possible switch from Symbicort (budesonide-formoterol) to Brovana+Pulmicort nebs. Spoke to patient's husband Audry Pili, who assists at home in patient's care.  Spoke to [place] at [facility] and prices for [employer] and [employer] were quoted as $135.36.  Patient [place] in [place] for free access to Symbicort. Audry Pili states that patient has been using Symbicort PRN. Provided education on dosing of Symbicort, 2 puffs BID rather than PRN, rinse mouth after use and spit. Advised use with spacer, contact clinic if further questions. [name] verbalized understanding.

## 2018-09-22 ENCOUNTER — Telehealth: Payer: Self-pay | Admitting: Nurse Practitioner

## 2018-09-22 NOTE — Telephone Encounter (Signed)
Andrea Townsend, please advise on pt if you think pharmacy staff might be able to help pt out with this. Thanks!

## 2018-09-23 NOTE — Telephone Encounter (Signed)
I am going to route this to [facility] just to let us know when patient has been taken care of and if there is anything we need to do.

## 2018-09-23 NOTE — Telephone Encounter (Signed)
Yes. I talked to them about it. I think they are looking into it. Thanks.

## 2018-09-25 NOTE — Telephone Encounter (Signed)
Called pt and spoke with spouse Audry Pili stating to him the info from [employer], pharmacist. I provided him the phone number for them to call to see if pt might be eligible for assistance with medication costs.

## 2018-10-06 ENCOUNTER — Telehealth: Payer: Self-pay | Admitting: Nurse Practitioner

## 2018-10-06 MED ORDER — ALBUTEROL SULFATE (2.5 MG/3ML) 0.083% IN NEBU: 2.5000 mg | INHALATION_SOLUTION | Freq: Four times a day (QID) | RESPIRATORY_TRACT | 11 refills | Status: DC

## 2018-10-06 NOTE — Telephone Encounter (Addendum)
I called to speak with daughter in law and she is requesting coupons for albuterol. I advised her that there was no coupons for albuterol since this medication is generic. I offered to send in the Rx to a DME pharmacy ([facility]) to see if it would be cheaper. She agreed and I sent the Rx to [facility]. We can leave in triage to call them tomorrow to check to see if they received it and the price of the medication.    [phone]

## 2018-10-07 MED ORDER — ALBUTEROL SULFATE (2.5 MG/3ML) 0.083% IN NEBU: 2.5000 mg | INHALATION_SOLUTION | Freq: Four times a day (QID) | RESPIRATORY_TRACT | 11 refills | Status: AC

## 2018-10-07 NOTE — Telephone Encounter (Signed)
Ok prescription quantity changed to 360. Nothing further is needed.

## 2018-10-07 NOTE — Telephone Encounter (Signed)
Estill Bamberg from [place] is calling about the quantity on the surescript is incorrect.  Sent in for 75 and needs to be 360.  Please advise.  [facility]:  [phone]

## 2018-10-07 NOTE — Telephone Encounter (Signed)
Left message for patient to see if she was able to complete enrollment application for Medicare Extra Help, Phone # [phone]. Advised to call office if she had any issues or questions.  11:00 AM Beatriz Chancellor, CPhT

## 2018-10-07 NOTE — Telephone Encounter (Signed)
Spoke with patient and daughter-in-law, who was advised by [employer] of [place] that the patient's household income is too high for extra help. She also stated that it would be the same case for Patient Assistance. Office sent in rx to [place] to bill Medicare part B. Provided pharmacy number to patient/daughter-in-law. They will follow up if the copay is unaffordable.  Patient only currently has Medicare Part A & B  Phone# [phone]  3:36 PM Beatriz Chancellor, CPhT

## 2018-10-10 NOTE — Telephone Encounter (Signed)
See telephone note dated 10/06/18 regarding nebulizer solution. Nothing further needed at this time.

## 2018-10-20 DIAGNOSIS — L820 Inflamed seborrheic keratosis: Secondary | ICD-10-CM | POA: Diagnosis not present

## 2018-12-22 ENCOUNTER — Other Ambulatory Visit: Payer: Self-pay | Admitting: Internal Medicine

## 2018-12-22 DIAGNOSIS — Z1231 Encounter for screening mammogram for malignant neoplasm of breast: Secondary | ICD-10-CM

## 2019-02-10 ENCOUNTER — Ambulatory Visit
Admission: RE | Admit: 2019-02-10 | Discharge: 2019-02-10 | Disposition: A | Discharge status: Home / Self Care | Payer: Medicare Other | Source: Ambulatory Visit | Attending: Internal Medicine | Admitting: Internal Medicine

## 2019-02-10 ENCOUNTER — Other Ambulatory Visit: Payer: Self-pay

## 2019-02-10 DIAGNOSIS — Z1231 Encounter for screening mammogram for malignant neoplasm of breast: Secondary | ICD-10-CM

## 2019-03-25 DIAGNOSIS — I10 Essential (primary) hypertension: Secondary | ICD-10-CM | POA: Diagnosis not present

## 2019-03-25 DIAGNOSIS — Z8673 Personal history of transient ischemic attack (TIA), and cerebral infarction without residual deficits: Secondary | ICD-10-CM | POA: Diagnosis not present

## 2019-03-25 DIAGNOSIS — J301 Allergic rhinitis due to pollen: Secondary | ICD-10-CM | POA: Diagnosis not present

## 2019-03-25 DIAGNOSIS — I69351 Hemiplegia and hemiparesis following cerebral infarction affecting right dominant side: Secondary | ICD-10-CM | POA: Diagnosis not present

## 2019-03-25 DIAGNOSIS — Z23 Encounter for immunization: Secondary | ICD-10-CM | POA: Diagnosis not present

## 2019-04-20 ENCOUNTER — Ambulatory Visit: Payer: Medicare Other | Attending: Internal Medicine

## 2019-04-20 DIAGNOSIS — Z23 Encounter for immunization: Secondary | ICD-10-CM | POA: Insufficient documentation

## 2019-04-20 NOTE — Progress Notes (Signed)
   Covid-19 Vaccination Clinic  Name:  Andrea Townsend    MRN: [id number] DOB: [birth year]  04/20/2019  [name] was observed post Covid-19 immunization for 15 minutes without incidence. She was provided with Vaccine Information Sheet and instruction to access the V-Safe system.   [name] was instructed to call 911 with any severe reactions post vaccine: Marland Kitchen Difficulty breathing  . Swelling of your face and throat  . A fast heartbeat  . A bad rash all over your body  . Dizziness and weakness    Immunizations Administered    Name Date Dose VIS Date Route   Pfizer COVID-19 Vaccine 04/20/2019  4:03 PM 0.3 mL 01/30/2019 Intramuscular   Manufacturer: [employer], [employer]   Lot: [id number]   NDC: [address]-9

## 2019-05-13 ENCOUNTER — Ambulatory Visit: Payer: Medicare Other | Attending: Internal Medicine

## 2019-05-13 DIAGNOSIS — Z23 Encounter for immunization: Secondary | ICD-10-CM

## 2019-05-13 NOTE — Progress Notes (Signed)
   Covid-19 Vaccination Clinic  Name:  Andrea Townsend    MRN: [id number] DOB: 24-Nov-1960  05/13/2019  [name] was observed post Covid-19 immunization for 15 minutes without incident. She was provided with Vaccine Information Sheet and instruction to access the V-Safe system.   [name] was instructed to call 911 with any severe reactions post vaccine: Marland Kitchen Difficulty breathing  . Swelling of face and throat  . A fast heartbeat  . A bad rash all over body  . Dizziness and weakness   Immunizations Administered    Name Date Dose VIS Date Route   Pfizer COVID-19 Vaccine 05/13/2019  3:30 PM 0.3 mL 01/30/2019 Intramuscular   Manufacturer: [employer], [employer]   Lot: [id number]   NDC: [address]-6

## 2019-07-07 DIAGNOSIS — Z8673 Personal history of transient ischemic attack (TIA), and cerebral infarction without residual deficits: Secondary | ICD-10-CM | POA: Diagnosis not present

## 2019-07-07 DIAGNOSIS — L98499 Non-pressure chronic ulcer of skin of other sites with unspecified severity: Secondary | ICD-10-CM | POA: Diagnosis not present

## 2019-07-07 DIAGNOSIS — I771 Stricture of artery: Secondary | ICD-10-CM | POA: Diagnosis not present

## 2019-08-25 DIAGNOSIS — R05 Cough: Secondary | ICD-10-CM | POA: Diagnosis not present

## 2019-09-10 DIAGNOSIS — Z8673 Personal history of transient ischemic attack (TIA), and cerebral infarction without residual deficits: Secondary | ICD-10-CM | POA: Diagnosis not present

## 2019-09-10 DIAGNOSIS — I69351 Hemiplegia and hemiparesis following cerebral infarction affecting right dominant side: Secondary | ICD-10-CM | POA: Diagnosis not present

## 2019-09-10 DIAGNOSIS — I10 Essential (primary) hypertension: Secondary | ICD-10-CM | POA: Diagnosis not present

## 2019-09-10 DIAGNOSIS — L97111 Non-pressure chronic ulcer of right thigh limited to breakdown of skin: Secondary | ICD-10-CM | POA: Diagnosis not present

## 2019-10-12 DIAGNOSIS — I10 Essential (primary) hypertension: Secondary | ICD-10-CM | POA: Diagnosis not present

## 2019-10-12 DIAGNOSIS — R7309 Other abnormal glucose: Secondary | ICD-10-CM | POA: Diagnosis not present

## 2019-10-12 DIAGNOSIS — L97111 Non-pressure chronic ulcer of right thigh limited to breakdown of skin: Secondary | ICD-10-CM | POA: Diagnosis not present

## 2019-10-12 DIAGNOSIS — I69351 Hemiplegia and hemiparesis following cerebral infarction affecting right dominant side: Secondary | ICD-10-CM | POA: Diagnosis not present

## 2019-10-13 DIAGNOSIS — Z0000 Encounter for general adult medical examination without abnormal findings: Secondary | ICD-10-CM | POA: Diagnosis not present

## 2019-10-13 DIAGNOSIS — Z1389 Encounter for screening for other disorder: Secondary | ICD-10-CM | POA: Diagnosis not present

## 2019-10-21 DIAGNOSIS — M79661 Pain in right lower leg: Secondary | ICD-10-CM | POA: Diagnosis not present

## 2019-10-21 DIAGNOSIS — M79604 Pain in right leg: Secondary | ICD-10-CM | POA: Diagnosis not present

## 2019-12-12 DIAGNOSIS — Z23 Encounter for immunization: Secondary | ICD-10-CM | POA: Diagnosis not present

## 2019-12-30 ENCOUNTER — Telehealth: Payer: Self-pay | Admitting: Adult Health

## 2019-12-30 DIAGNOSIS — J45909 Unspecified asthma, uncomplicated: Secondary | ICD-10-CM

## 2019-12-30 MED ORDER — BUDESONIDE-FORMOTEROL FUMARATE 160-4.5 MCG/ACT IN AERO: 2.0000 | INHALATION_SPRAY | Freq: Two times a day (BID) | RESPIRATORY_TRACT | 0 refills | Status: DC

## 2019-12-30 NOTE — Telephone Encounter (Signed)
Spoke with the pt's spouse, [name]  Pt needing rx for symbicort- rf x 1 only and scheduled new pt appt with Dr Celine Mans for 01/26/20  Nothing further needed

## 2020-01-18 ENCOUNTER — Other Ambulatory Visit: Payer: Self-pay | Admitting: Internal Medicine

## 2020-01-18 ENCOUNTER — Telehealth: Payer: Self-pay | Admitting: Internal Medicine

## 2020-01-18 DIAGNOSIS — R739 Hyperglycemia, unspecified: Secondary | ICD-10-CM | POA: Diagnosis not present

## 2020-01-18 DIAGNOSIS — M79604 Pain in right leg: Secondary | ICD-10-CM | POA: Diagnosis not present

## 2020-01-18 DIAGNOSIS — Z8673 Personal history of transient ischemic attack (TIA), and cerebral infarction without residual deficits: Secondary | ICD-10-CM | POA: Diagnosis not present

## 2020-01-18 DIAGNOSIS — I10 Essential (primary) hypertension: Secondary | ICD-10-CM | POA: Diagnosis not present

## 2020-01-18 DIAGNOSIS — Z9289 Personal history of other medical treatment: Secondary | ICD-10-CM | POA: Diagnosis not present

## 2020-01-18 NOTE — Telephone Encounter (Signed)
Per Leotis Shames - symbicort is now generic and we no loner carry samples - spoke to patient significant other advised of this - pt hasn't tried to pick up medication from [facility] when it was called in on 12/30/2019 advised to call us back after contacting [facility] if medication is still too expensive to ask about alternatives, he voiced understanding. -pr

## 2020-01-26 ENCOUNTER — Ambulatory Visit: Payer: Medicare Other | Admitting: Internal Medicine

## 2020-02-01 ENCOUNTER — Ambulatory Visit (INDEPENDENT_AMBULATORY_CARE_PROVIDER_SITE_OTHER): Payer: Medicare Other | Admitting: Primary Care

## 2020-02-01 ENCOUNTER — Encounter: Payer: Self-pay | Admitting: Primary Care

## 2020-02-01 DIAGNOSIS — J309 Allergic rhinitis, unspecified: Secondary | ICD-10-CM

## 2020-02-01 DIAGNOSIS — J45909 Unspecified asthma, uncomplicated: Secondary | ICD-10-CM | POA: Diagnosis not present

## 2020-02-01 MED ORDER — BUDESONIDE-FORMOTEROL FUMARATE 160-4.5 MCG/ACT IN AERO: 2.0000 | INHALATION_SPRAY | Freq: Two times a day (BID) | RESPIRATORY_TRACT | 11 refills | Status: DC

## 2020-02-01 NOTE — Patient Instructions (Addendum)
Recommendations: - Continue Symbicort 160 two puffs twice daily (rinse mouth after use) - Continue flonase nasal spray daily  - Continue Loratadine (Claritin) 10mg  daily  - Try ocean/saline nasal spray 1-2 puffs per nostril twice daily (over the counter) - Take mucinex 1-2 tablets every 12 hours as needed for congestion   Please call number on [place] and [place] form to follow-up on prescription, we originally sent when when you applied. It says it can take 10 business days to received and today would be the 10th day. They should be mailing to you directly   Follow-up: - 6 months with Dr. or Celine Mans (new patient, asthma)

## 2020-02-01 NOTE — Progress Notes (Signed)
@Patient  ID: , female    DOB: 01/14/1961, 59 y.o.   MRN: [id number]  Chief Complaint  Patient presents with  . Follow-up    Reports nasal congestion over the past several days. Denies fevers    Referring provider: [id number]  HPI: 59 year old female, former smoker. 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 Former patient of Dr. 46, last seen by pulmonary NP in July 2020. Treated for acute exacerbation with Dop-medrol injection, prednisone taper and Azithromycin. Advised to follow-up in 3 months  02/01/2020 Patient presents today for overdue follow-up/needing medication refill. Accompanied by female family member who does some of the speak for her d/t past stroke. She is doing well. No breathing difficulties. She is maintained on Symbicort 160, ran our of maintenance inhaler 2-3 months ago. She reports increased nasal congestion with clear mucus and dry cough. States that Symbicort helps her cough. She takes Claritin and Flonase daily.   No Known Allergies  Immunization History  Administered Date(s) Administered  . Influenza,inj,Quad PF,6+ Mos 11/10/2014, 11/19/2017  . PFIZER SARS-COV-2 Vaccination 04/20/2019, 05/13/2019    Past Medical History:  Diagnosis Date  . Bronchitis   . Stroke [facility]     Tobacco History: Social History   Tobacco Use  Smoking Status Former Smoker  . Packs/day: 1.00  . Years: 20.00  . Pack years: 20.00  . Types: Cigarettes  . Quit date: 09/22/2006  . Years since quitting: 13.3  Smokeless Tobacco Never Used   Counseling given: Not Answered   Outpatient Medications Prior to Visit  Medication Sig Dispense Refill  . albuterol (PROVENTIL) (2.5 MG/3ML) 0.083% nebulizer solution Take 3 mLs (2.5 mg total) by nebulization every 6 (six) hours. DX: J45.909 360 mL 11  . albuterol (VENTOLIN HFA) 108 (90 Base) MCG/ACT inhaler Inhale 2 puffs into the lungs  every 4 (four) hours as needed for wheezing or shortness of breath. 7 g 3  . azithromycin (ZITHROMAX) 250 MG tablet Take 2 tablets (500 mg) on day 1, then take 1 tablet (250 mg) on days 2-5 6 tablet 0  . Biotin 1000 MCG tablet Take 1,000 mcg by mouth 3 (three) times daily.    11/22/2006 lisinopril (ZESTRIL) 10 MG tablet Take 1 tablet by mouth daily.    Marland Kitchen loratadine (CLARITIN) 10 MG tablet Take 10 mg by mouth daily.    . Multiple Vitamin (MULTIVITAMIN WITH MINERALS) TABS tablet Take 1 tablet by mouth daily.    Marland Kitchen omega-3 acid ethyl esters (LOVAZA) 1 G capsule Take 1 capsule by mouth 2 (two) times daily.    . predniSONE (DELTASONE) 10 MG tablet Take 4 tabs for 2 days, then 3 tabs for 2 days, then 2 tabs for 2 days, then 1 tab for 2 days, then stop 20 tablet 0  . Spacer/Aero-Holding Chambers (AEROCHAMBER MV) inhaler Use as instructed 1 each 0  . budesonide-formoterol (SYMBICORT) 160-4.5 MCG/ACT inhaler Inhale 2 puffs into the lungs 2 (two) times daily. 10.2 g 0   No facility-administered medications prior to visit.   Review of Systems  Review of Systems  Constitutional: Negative.   HENT: Positive for congestion and postnasal drip.   Respiratory: Positive for cough. Negative for chest tightness, shortness of breath and wheezing.   Cardiovascular: Negative.    Physical Exam  BP (!) 142/84   Pulse 88   Temp 98.3 F (36.8 C)   Ht 5\' 3"  (1.6 m)   Wt 230  lb (104.3 kg)   SpO2 98%   BMI 40.74 kg/m  Physical Exam Constitutional:      Appearance: Normal appearance.  HENT:     Head: Normocephalic and atraumatic.     Mouth/Throat:     Mouth: Mucous membranes are moist.     Pharynx: Oropharynx is clear.  Cardiovascular:     Rate and Rhythm: Normal rate and regular rhythm.  Pulmonary:     Effort: Pulmonary effort is normal.     Breath sounds: Normal breath sounds.     Comments: CTA Musculoskeletal:        General: Normal range of motion.  Skin:    General: Skin is warm and dry.  Neurological:      General: No focal deficit present.     Mental Status: She is alert and oriented to person, place, and time. Mental status is at baseline.  Psychiatric:        Mood and Affect: Mood normal.        Behavior: Behavior normal.        Thought Content: Thought content normal.        Judgment: Judgment normal.      Lab Results:  CBC    Component Value Date/Time   WBC 7.2 04/13/2014 1654   RBC 4.64 04/13/2014 1654   HGB 14.3 04/13/2014 1654   HCT 43.3 04/13/2014 1654   PLT 323.0 04/13/2014 1654   MCV 93.2 04/13/2014 1654   MCH 31.1 09/21/2013 0248   MCHC 33.1 04/13/2014 1654   RDW 15.1 04/13/2014 1654   LYMPHSABS 2.2 04/13/2014 1654   MONOABS 0.3 04/13/2014 1654   EOSABS 0.5 04/13/2014 1654   BASOSABS 0.0 04/13/2014 1654    BMET    Component Value Date/Time   NA 141 09/21/2013 0257   K 4.0 09/21/2013 0257   CL 107 09/21/2013 0257   CO2 21 08/17/2013 1408   GLUCOSE 105 (H) 09/21/2013 0257   BUN 11 09/21/2013 0257   CREATININE 0.90 09/21/2013 0257   CALCIUM 9.3 08/17/2013 1408   GFRNONAA >90 08/17/2013 1408   GFRAA >90 08/17/2013 1408    BNP No results found for: BNP  ProBNP    Component Value Date/Time   PROBNP 25.6 08/17/2013 1408    Imaging: No results found.   Assessment & Plan:   Asthma, chronic - Stable; No recent exacerbations - Continue Symbicort 160 two puffs twice daily (rinse mouth after use) - Received patient assistance from [facility] & [place] from November 2021 through November 2022. Awaiting delivery of medication.   Allergic rhinitis - Increased nasal congestion with clear mucus production  - Recommend continue flonase and Loratadine 10mg  daily as she has been doing - Try adding ocean/saline nasal spray 1-2 puffs per nostril twice daily  - Take mucinex 1-2 tablets every 12 hours as needed for congestion    , NP 02/01/2020

## 2020-02-01 NOTE — Assessment & Plan Note (Signed)
-   Stable; No recent exacerbations - Continue Symbicort 160 two puffs twice daily (rinse mouth after use) - Received patient assistance from [facility] & [place] from November 2021 through November 2022. Awaiting delivery of medication.

## 2020-02-01 NOTE — Assessment & Plan Note (Signed)
-   Increased nasal congestion with clear mucus production  - Recommend continue flonase and Loratadine 10mg  daily as she has been doing - Try adding ocean/saline nasal spray 1-2 puffs per nostril twice daily  - Take mucinex 1-2 tablets every 12 hours as needed for congestion

## 2020-02-02 ENCOUNTER — Telehealth: Payer: Self-pay | Admitting: Primary Care

## 2020-02-02 NOTE — Telephone Encounter (Signed)
Called and spoke with pt's husband Andrea Townsend who stated he received a letter from AZ&ME about pt's re-enrollment application for her Symbicort stating that pt had been approved patient assistance. He stated that AZ&ME was still waiting on the prescriber's part of the application to be sent in showing the prescription. This is what they need to have in order to send pt the medication. Stated to Andrea Townsend that I would call AZ&ME for clarification and he verbalized understanding.  Tried to call AZ&ME but the wait time was going to be greater than 30-16min so hung up. Called Andrea Townsend back letting him know this and that we would resubmit the prescriber portion of the application to AZ&ME and he verbalized understanding. Prescriber portion has been filled out again and has been signed by Andrea Townsend and faxed to AZ&ME for pt. Nothing further needed.

## 2020-02-08 ENCOUNTER — Other Ambulatory Visit: Payer: Self-pay

## 2020-02-08 ENCOUNTER — Ambulatory Visit
Admission: RE | Admit: 2020-02-08 | Discharge: 2020-02-08 | Disposition: A | Discharge status: Home / Self Care | Payer: Medicare Other | Source: Ambulatory Visit | Attending: Internal Medicine | Admitting: Internal Medicine

## 2020-02-08 DIAGNOSIS — I743 Embolism and thrombosis of arteries of the lower extremities: Secondary | ICD-10-CM | POA: Diagnosis not present

## 2020-02-08 DIAGNOSIS — Z872 Personal history of diseases of the skin and subcutaneous tissue: Secondary | ICD-10-CM | POA: Diagnosis not present

## 2020-02-08 DIAGNOSIS — Z9289 Personal history of other medical treatment: Secondary | ICD-10-CM

## 2020-02-08 DIAGNOSIS — I745 Embolism and thrombosis of iliac artery: Secondary | ICD-10-CM | POA: Diagnosis not present

## 2020-02-08 DIAGNOSIS — I708 Atherosclerosis of other arteries: Secondary | ICD-10-CM | POA: Diagnosis not present

## 2020-02-08 MED ORDER — IOPAMIDOL (ISOVUE-370) INJECTION 76%
125.0000 mL | Freq: Once | INTRAVENOUS | Status: AC | PRN
  Administered 2020-02-08: 125 mL via INTRAVENOUS

## 2020-03-02 ENCOUNTER — Encounter: Payer: Medicare Other | Admitting: Vascular Surgery

## 2020-03-04 ENCOUNTER — Ambulatory Visit (INDEPENDENT_AMBULATORY_CARE_PROVIDER_SITE_OTHER): Payer: Medicare Other | Admitting: Vascular Surgery

## 2020-03-04 ENCOUNTER — Encounter: Payer: Self-pay | Admitting: Vascular Surgery

## 2020-03-04 ENCOUNTER — Other Ambulatory Visit: Payer: Self-pay

## 2020-03-04 VITALS — BP 125/86 | HR 84 | Temp 97.7°F | Resp 20 | Ht 63.0 in | Wt 230.0 lb

## 2020-03-04 DIAGNOSIS — I739 Peripheral vascular disease, unspecified: Secondary | ICD-10-CM

## 2020-03-04 NOTE — Progress Notes (Signed)
Patient ID: Andrea Townsend, female   DOB: Apr 30, 1960, 60 y.o.   MRN: [id number]  Reason for Consult: New Patient (Initial Visit)   Referred by Lorenda Ishihara,*  Subjective:     HPI:  Andrea Townsend is a 60 y.o. female history of stroke in the past that has rendered her right upper and lower extremity paralyzed.  More recently she has had pain in the right lower extremity particularly with sitting for long periods of time in the vehicle and then moving.  She is also had an ulcer on her thigh and one on her posterior calf although these do some what appears to be improving.  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  She has never had any arterial intervention.  Risk factors include diabetes, hyperlipidemia and hypertension.  She is not on any blood thinners.  She does take Lipitor.  She is a former smoker having quit at the time of her stroke.  Past Medical History:  Diagnosis Date  . Bronchitis   . Diabetes mellitus without complication (HCC)   . Hyperlipidemia   . Hypertension   . Stroke [facility]    Family History  Problem Relation Age of Onset  . Asthma Mother        hospitalization with exacerbation  . Asthma Sister        hospitalization with exacerbation   Past Surgical History:  Procedure Laterality Date  . ABDOMINAL SURGERY    . CESAREAN SECTION      Short Social History:  Social History   Tobacco Use  . Smoking status: Former Smoker    Packs/day: 1.00    Years: 20.00    Pack years: 20.00    Types: Cigarettes    Quit date: 09/22/2006    Years since quitting: 13.4  . Smokeless tobacco: Never Used  Substance Use Topics  . Alcohol use: No    No Known Allergies  Current Outpatient Medications  Medication Sig Dispense Refill  . albuterol (PROVENTIL) (2.5 MG/3ML) 0.083% nebulizer solution Take 3 mLs (2.5 mg total) by nebulization every 6 (six) hours. DX: J45.909 360 mL 11  . albuterol (VENTOLIN HFA)  108 (90 Base) MCG/ACT inhaler Inhale 2 puffs into the lungs every 4 (four) hours as needed for wheezing or shortness of breath. 7 g 3  . atorvastatin (LIPITOR) 20 MG tablet 1 tablet    . Biotin 1000 MCG tablet Take 1,000 mcg by mouth 3 (three) times daily.    . budesonide-formoterol (SYMBICORT) 160-4.5 MCG/ACT inhaler Inhale 2 puffs into the lungs 2 (two) times daily. 10.2 g 11  . lisinopril (ZESTRIL) 10 MG tablet Take 1 tablet by mouth daily.    Marland Kitchen loratadine (CLARITIN) 10 MG tablet Take 10 mg by mouth daily.    . metFORMIN (GLUCOPHAGE) 500 MG tablet 1 tablet with a meal    . Multiple Vitamin (MULTIVITAMIN WITH MINERALS) TABS tablet Take 1 tablet by mouth daily.    Marland Kitchen omega-3 acid ethyl esters (LOVAZA) 1 G capsule Take 1 capsule by mouth 2 (two) times daily.    Marland Kitchen Spacer/Aero-Holding Chambers (AEROCHAMBER MV) inhaler Use as instructed 1 each 0   No current facility-administered medications for this visit.    Review of Systems  Constitutional:  Constitutional negative. HENT: HENT negative.  Eyes: Eyes negative.  Respiratory: Respiratory negative.  Cardiovascular: Positive for leg swelling.  GI: Gastrointestinal negative.  Musculoskeletal: Positive for leg pain.  Skin: Positive for wound.  Neurological: Neurological negative. Hematologic: Hematologic/lymphatic negative.  Psychiatric: Psychiatric negative.        Objective:  Objective   Vitals:   03/04/20 0933  BP: 125/86  Pulse: 84  Resp: 20  Temp: 97.7 F (36.5 C)  SpO2: 95%  Weight: 230 lb (104.3 kg)  Height: 5\' 3"  (1.6 m)   Body mass index is 40.74 kg/m.  Physical Exam HENT:     Head: Normocephalic.     Nose:     Comments: Wearing a mask Eyes:     Pupils: Pupils are equal, round, and reactive to light.  Cardiovascular:     Pulses:          Radial pulses are 2+ on the right side and 2+ on the left side.       Femoral pulses are 0 on the right side.      Popliteal pulses are 0 on the right side and 2+ on the left  side.  Pulmonary:     Effort: Pulmonary effort is normal.  Abdominal:     General: Abdomen is flat.     Palpations: Abdomen is soft.  Musculoskeletal:     Right lower leg: Edema present.     Left lower leg: No edema.  Skin:    General: Skin is warm.     Capillary Refill: Capillary refill takes less than 2 seconds.     Comments: Small ulceration that is dry on the posterior calf on the right approximately 1 cm diameter  Neurological:     General: No focal deficit present.     Mental Status: She is alert.  Psychiatric:        Mood and Affect: Mood normal.        Behavior: Behavior normal.        Thought Content: Thought content normal.        Judgment: Judgment normal.     Data: CTA IMPRESSION: 1. 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 2. Widely patent LEFT lower extremity arterial outflow with three-vessel runoff. 3. Calcified  uterine fibroids.     Assessment/Plan:     60 year old female with history of stroke with paralyzed right upper and lower extremity.  She has ulcerations on the right lower extremity which are healing she does have pain of the right lower extremity which mostly occurs at rest and unlikely related to vascular disease.  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  I discussed with him the options being medical therapy with aspirin and statin and watchful waiting of the wounds versus angiography and ultimately she may require bypass either common iliac artery on the right to common femoral or femorofemoral bypass or possibly even right common femoral endart with retrograde external iliac artery stenting if this is possible.  At this time the patient is going to wait another 3 to 4 weeks to see if the wounds will heal we will get her  back in the office with ABIs to discuss our options.  Certainly if the wounds worsen she needs angiography with plan for revascularization.  They demonstrate good understanding we will get them scheduled to follow-up in a few weeks.     46 MD [facility]

## 2020-03-08 ENCOUNTER — Other Ambulatory Visit: Payer: Self-pay

## 2020-03-08 DIAGNOSIS — I739 Peripheral vascular disease, unspecified: Secondary | ICD-10-CM

## 2020-03-21 ENCOUNTER — Other Ambulatory Visit: Payer: Self-pay | Admitting: Internal Medicine

## 2020-03-21 DIAGNOSIS — Z1231 Encounter for screening mammogram for malignant neoplasm of breast: Secondary | ICD-10-CM

## 2020-04-01 ENCOUNTER — Ambulatory Visit (HOSPITAL_COMMUNITY)
Admission: RE | Admit: 2020-04-01 | Discharge: 2020-04-01 | Disposition: A | Discharge status: Home / Self Care | Payer: Medicare Other | Source: Ambulatory Visit | Attending: Vascular Surgery | Admitting: Vascular Surgery

## 2020-04-01 ENCOUNTER — Encounter: Payer: Self-pay | Admitting: Vascular Surgery

## 2020-04-01 ENCOUNTER — Ambulatory Visit (INDEPENDENT_AMBULATORY_CARE_PROVIDER_SITE_OTHER): Payer: Medicare Other | Admitting: Vascular Surgery

## 2020-04-01 ENCOUNTER — Other Ambulatory Visit: Payer: Self-pay

## 2020-04-01 VITALS — BP 118/80 | HR 75 | Temp 97.4°F | Resp 20 | Ht 63.0 in | Wt 230.0 lb

## 2020-04-01 DIAGNOSIS — I739 Peripheral vascular disease, unspecified: Secondary | ICD-10-CM

## 2020-04-01 NOTE — Progress Notes (Signed)
Patient ID: Andrea Townsend, female   DOB: 1960/04/30, 60 y.o.   MRN: [id number]  Reason for Consult: Follow-up   Referred by Lorenda Ishihara,*  Subjective:     HPI:  Andrea Townsend is a 60 y.o. 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  I recently saw her for ulcer on her thigh and posterior calf.  We reviewed CT scan at that time.  Per the patient's husband her wounds are improving.  He has returned to work.  She does not have any new complaints related to today's visit.  Past Medical History:  Diagnosis Date  . Bronchitis   . Diabetes mellitus without complication (HCC)   . Hyperlipidemia   . Hypertension   . Stroke [facility])    Family History  Problem Relation Age of Onset  . Asthma Mother        hospitalization with exacerbation  . Asthma Sister        hospitalization with exacerbation   Past Surgical History:  Procedure Laterality Date  . ABDOMINAL SURGERY    . CESAREAN SECTION      Short Social History:  Social History   Tobacco Use  . Smoking status: Former Smoker    Packs/day: 1.00    Years: 20.00    Pack years: 20.00    Types: Cigarettes    Quit date: 09/22/2006    Years since quitting: 13.5  . Smokeless tobacco: Never Used  Substance Use Topics  . Alcohol use: No    No Known Allergies  Current Outpatient Medications  Medication Sig Dispense Refill  . albuterol (PROVENTIL) (2.5 MG/3ML) 0.083% nebulizer solution Take 3 mLs (2.5 mg total) by nebulization every 6 (six) hours. DX: J45.909 360 mL 11  . albuterol (VENTOLIN HFA) 108 (90 Base) MCG/ACT inhaler Inhale 2 puffs into the lungs every 4 (four) hours as needed for wheezing or shortness of breath. 7 g 3  . atorvastatin (LIPITOR) 20 MG tablet 1 tablet    . Biotin 1000 MCG tablet Take 1,000 mcg by mouth 3 (three) times daily.    . budesonide-formoterol (SYMBICORT) 160-4.5 MCG/ACT inhaler Inhale 2 puffs into the lungs 2 (two)  times daily. 10.2 g 11  . lisinopril (ZESTRIL) 10 MG tablet Take 1 tablet by mouth daily.    Marland Kitchen loratadine (CLARITIN) 10 MG tablet Take 10 mg by mouth daily.    . metFORMIN (GLUCOPHAGE) 500 MG tablet 1 tablet with a meal    . Multiple Vitamin (MULTIVITAMIN WITH MINERALS) TABS tablet Take 1 tablet by mouth daily.    Marland Kitchen omega-3 acid ethyl esters (LOVAZA) 1 G capsule Take 1 capsule by mouth 2 (two) times daily.    Marland Kitchen Spacer/Aero-Holding Chambers (AEROCHAMBER MV) inhaler Use as instructed 1 each 0   No current facility-administered medications for this visit.    Review of Systems  Constitutional:  Constitutional negative. HENT: HENT negative.  Eyes: Eyes negative.  Respiratory: Respiratory negative.  Cardiovascular: Cardiovascular negative.  GI: Gastrointestinal negative.  GU: Genitourinary negative. Musculoskeletal: Musculoskeletal negative.  Skin: Positive for wound.  Neurological: Neurological negative. Hematologic: Hematologic/lymphatic negative.  Psychiatric: Psychiatric negative.        Objective:  Objective   Vitals:   04/01/20 1041  BP: 118/80  Pulse: 75  Resp: 20  Temp: (!) 97.4 F (36.3 C)  SpO2: 95%  Weight: 230 lb (104.3 kg)  Height: 5\' 3"  (1.6 m)   Body mass index is  40.74 kg/m.  Physical Exam HENT:     Head: Normocephalic.     Nose:     Comments: Wearing a mask Eyes:     Pupils: Pupils are equal, round, and reactive to light.  Cardiovascular:     Pulses:          Femoral pulses are 0 on the right side and 2+ on the left side.      Popliteal pulses are 0 on the right side and 2+ on the left side.  Pulmonary:     Effort: Pulmonary effort is normal.  Abdominal:     General: Abdomen is flat.     Palpations: Abdomen is soft.  Musculoskeletal:     Right lower leg: Edema present.     Left lower leg: No edema.  Skin:    General: Skin is warm.     Comments: Stable 1 cm ulcer right posterior calf  Neurological:     Mental Status: She is alert.     Deep  Tendon Reflexes: Abnormal reflex:      Comments: Paralysis right upper and lower extremity     Data: ABI Findings:  +---------+------------------+-----+----------+--------+  Right  Rt Pressure (mmHg)IndexWaveform Comment   +---------+------------------+-----+----------+--------+  Brachial 124                      +---------+------------------+-----+----------+--------+  PTA   80        0.64 monophasic      +---------+------------------+-----+----------+--------+  DP    0         0.00 absent        +---------+------------------+-----+----------+--------+  Great Toe60        0.48            +---------+------------------+-----+----------+--------+   +---------+------------------+-----+--------+-------+  Left   Lt Pressure (mmHg)IndexWaveformComment  +---------+------------------+-----+--------+-------+  Brachial 125                     +---------+------------------+-----+--------+-------+  PTA   145        1.16 biphasic      +---------+------------------+-----+--------+-------+  DP    138        1.10 biphasic      +---------+------------------+-----+--------+-------+  Great Toe105        0.84           +---------+------------------+-----+--------+-------+   +-------+-----------+-----------+------------+------------+  ABI/TBIToday's ABIToday's TBIPrevious ABIPrevious TBI  +-------+-----------+-----------+------------+------------+  Right 0.64    0.48                  +-------+-----------+-----------+------------+------------+  Left  1.16    0.84                  +-------+-----------+-----------+------------+------------+      Assessment/Plan:     60 year old female here for evaluation of wounds on right lower extremity.  These appear to be  healing without any previous intervention.  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  I discussed that we can continue to watch these for another 6 months she can follow-up with ABIs.  If they worsen she would need consideration of right common iliac artery to right common femoral artery bypass versus left to right femorofemoral bypass.  Unfortunately with her positioning in her wheelchair this would put quite a bit of stress on her groin incisions I do think she would have difficulty healing.  At this time the husband would like to wait he will call us if they worsen we will otherwise see her in  6 months.     Maeola Harman MD [facility]

## 2020-04-04 ENCOUNTER — Other Ambulatory Visit: Payer: Self-pay

## 2020-04-04 DIAGNOSIS — I739 Peripheral vascular disease, unspecified: Secondary | ICD-10-CM

## 2020-05-04 ENCOUNTER — Inpatient Hospital Stay: Admission: RE | Admit: 2020-05-04 | Payer: Medicare Other | Source: Ambulatory Visit

## 2020-05-17 DIAGNOSIS — Z124 Encounter for screening for malignant neoplasm of cervix: Secondary | ICD-10-CM | POA: Diagnosis not present

## 2020-05-17 DIAGNOSIS — I10 Essential (primary) hypertension: Secondary | ICD-10-CM | POA: Diagnosis not present

## 2020-05-17 DIAGNOSIS — E669 Obesity, unspecified: Secondary | ICD-10-CM | POA: Diagnosis not present

## 2020-07-26 ENCOUNTER — Ambulatory Visit: Payer: Medicare Other | Admitting: Internal Medicine

## 2020-08-12 ENCOUNTER — Ambulatory Visit: Payer: Medicare Other

## 2020-08-16 ENCOUNTER — Inpatient Hospital Stay: Admission: RE | Admit: 2020-08-16 | Payer: Medicare Other | Source: Ambulatory Visit

## 2020-08-16 ENCOUNTER — Ambulatory Visit: Payer: Medicare Other

## 2020-09-01 ENCOUNTER — Ambulatory Visit: Payer: Medicare Other | Admitting: Internal Medicine

## 2020-09-29 ENCOUNTER — Ambulatory Visit (INDEPENDENT_AMBULATORY_CARE_PROVIDER_SITE_OTHER): Payer: Medicare Other | Admitting: Pulmonary Disease

## 2020-09-29 ENCOUNTER — Encounter: Payer: Self-pay | Admitting: Pulmonary Disease

## 2020-09-29 ENCOUNTER — Other Ambulatory Visit: Payer: Self-pay

## 2020-09-29 VITALS — BP 126/72 | HR 82 | Temp 98.5°F | Ht 62.0 in | Wt 235.8 lb

## 2020-09-29 DIAGNOSIS — J45909 Unspecified asthma, uncomplicated: Secondary | ICD-10-CM | POA: Diagnosis not present

## 2020-09-29 DIAGNOSIS — J309 Allergic rhinitis, unspecified: Secondary | ICD-10-CM

## 2020-09-29 MED ORDER — PROAIR DIGIHALER 108 (90 BASE) MCG/ACT IN AEPB: 0.6500 g | INHALATION_SPRAY | Freq: Every day | RESPIRATORY_TRACT | 0 refills | Status: DC

## 2020-09-29 NOTE — Progress Notes (Signed)
Subjective:   PATIENT ID: Andrea Townsend GENDER: female DOB: [birth year], MRN: [id number]   HPI  Chief Complaint  Patient presents with   Follow-up    Pt states she has been doing okay since last visit. Denies any complaints of wheezing, SOB, or chest tightness.    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 She was previously followed by Dr. Kendrick Fries.  She has been on Symbicort 160-4.5 MCG 2 puffs twice daily and as needed albuterol for her asthma since last visit.  She denies any frequent use of her albuterol rescue inhaler.  She reports her breathing has been under good control since last visit.  She continues to take Flonase and Claritin for rhinitis.  She continues to have issues with runny nose but denies significant postnasal drainage.  She denies any significant heartburn or reflux symptoms.  She is accompanied by her husband.  Past Medical History:  Diagnosis Date   Bronchitis    Diabetes mellitus without complication (HCC)    Hyperlipidemia    Hypertension    Stroke (HCC)      Family History  Problem Relation Age of Onset   Asthma Mother        hospitalization with exacerbation   Asthma Sister        hospitalization with exacerbation     Social History   Socioeconomic History   Marital status: Married    Spouse name: Not on file   Number of children: Not on file   Years of education: Not on file   Highest education level: Not on file  Occupational History   Not on file  Tobacco Use   Smoking status: Former    Packs/day: 1.00    Years: 20.00    Pack years: 20.00    Types: Cigarettes    Quit date: 09/22/2006    Years since quitting: 14.0   Smokeless tobacco: Never  Vaping Use   Vaping Use: Never used  Substance and Sexual Activity   Alcohol use: No   Drug use: No   Sexual activity: Not on file  Other  Topics Concern   Not on file  Social History Narrative   Not on file   Social Determinants of Health   Financial Resource Strain: Not on file  Food Insecurity: Not on file  Transportation Needs: Not on file  Physical Activity: Not on file  Stress: Not on file  Social Connections: Not on file  Intimate Partner Violence: Not on file     No Known Allergies   Outpatient Medications Prior to Visit  Medication Sig Dispense Refill   albuterol (PROVENTIL) (2.5 MG/3ML) 0.083% nebulizer solution Take 3 mLs (2.5 mg total) by nebulization every 6 (six) hours. DX: J45.909 360 mL 11   albuterol (VENTOLIN HFA) 108 (90 Base) MCG/ACT inhaler Inhale 2 puffs into the lungs every 4 (four) hours as needed for wheezing or shortness of breath. 7 g 3   atorvastatin (LIPITOR) 20 MG tablet 1 tablet     Biotin 1000 MCG tablet Take 1,000 mcg by mouth 3 (three) times daily.     budesonide-formoterol (SYMBICORT) 160-4.5 MCG/ACT inhaler Inhale 2 puffs into the lungs 2 (two) times daily. 10.2 g 11   lisinopril (ZESTRIL) 10 MG tablet Take 1 tablet by mouth daily.     loratadine (CLARITIN) 10 MG tablet Take 10 mg by  mouth daily.     metFORMIN (GLUCOPHAGE) 500 MG tablet 1 tablet with a meal     Multiple Vitamin (MULTIVITAMIN WITH MINERALS) TABS tablet Take 1 tablet by mouth daily.     omega-3 acid ethyl esters (LOVAZA) 1 G capsule Take 1 capsule by mouth 2 (two) times daily.     Spacer/Aero-Holding Chambers (AEROCHAMBER MV) inhaler Use as instructed 1 each 0   No facility-administered medications prior to visit.    Review of Systems  Constitutional:  Negative for chills, fever, malaise/fatigue and weight loss.  HENT:  Negative for congestion, sinus pain and sore throat.   Eyes: Negative.   Respiratory:  Negative for cough, hemoptysis, sputum production, shortness of breath and wheezing.   Cardiovascular:  Negative for chest pain, palpitations, orthopnea, claudication and leg swelling.  Gastrointestinal:   Negative for abdominal pain, heartburn, nausea and vomiting.  Genitourinary: Negative.   Musculoskeletal:  Negative for joint pain and myalgias.  Skin:  Negative for rash.  Neurological:  Negative for weakness.  Endo/Heme/Allergies: Negative.   Psychiatric/Behavioral: Negative.     Objective:   Vitals:   09/29/20 1548  BP: 126/72  Pulse: 82  Temp: 98.5 F (36.9 C)  TempSrc: Oral  SpO2: 98%  Weight: 235 lb 12.8 oz (107 kg)  Height: 5\' 2"  (1.575 m)   Physical Exam Constitutional:      General: She is not in acute distress.    Appearance: She is not ill-appearing.  HENT:     Head: Normocephalic and atraumatic.  Eyes:     General: No scleral icterus.    Conjunctiva/sclera: Conjunctivae normal.     Pupils: Pupils are equal, round, and reactive to light.  Cardiovascular:     Rate and Rhythm: Normal rate and regular rhythm.     Pulses: Normal pulses.     Heart sounds: Normal heart sounds. No murmur heard. Pulmonary:     Effort: Pulmonary effort is normal.     Breath sounds: Normal breath sounds. No wheezing, rhonchi or rales.  Abdominal:     General: Bowel sounds are normal.     Palpations: Abdomen is soft.  Musculoskeletal:     Right lower leg: No edema.     Left lower leg: No edema.  Lymphadenopathy:     Cervical: No cervical adenopathy.  Skin:    General: Skin is warm and dry.  Neurological:     General: No focal deficit present.     Mental Status: She is alert.  Psychiatric:        Mood and Affect: Mood normal.        Behavior: Behavior normal.        Thought Content: Thought content normal.        Judgment: Judgment normal.    CBC    Component Value Date/Time   WBC 7.2 04/13/2014 1654   RBC 4.64 04/13/2014 1654   HGB 14.3 04/13/2014 1654   HCT 43.3 04/13/2014 1654   PLT 323.0 04/13/2014 1654   MCV 93.2 04/13/2014 1654   MCH 31.1 09/21/2013 0248   MCHC 33.1 04/13/2014 1654   RDW 15.1 04/13/2014 1654   LYMPHSABS 2.2 04/13/2014 1654   MONOABS 0.3  04/13/2014 1654   EOSABS 0.5 04/13/2014 1654   BASOSABS 0.0 04/13/2014 1654   BMP Latest Ref Rng & Units 09/21/2013 08/17/2013 11/25/2009  Glucose 70 - 99 mg/dL 01/25/2010) 767(M) 094(B)  BUN 6 - 23 mg/dL 11 12 12   Creatinine 0.50 - 1.10 mg/dL 096(G 8.36  Sodium 137 - 147 mEq/L 141 140 136  Potassium 3.7 - 5.3 mEq/L 4.0 3.9 4.0  Chloride 96 - 112 mEq/L 107 104 109  CO2 19 - 32 mEq/L - 21 21  Calcium 8.4 - 10.5 mg/dL - 9.3 9.2   Chest imaging: CXR 09/20/2013 The heart size and mediastinal contours are within normal limits.  Both lungs are clear. The visualized skeletal structures are  unremarkable.  PFT: No flowsheet data found.    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   Her asthma appears to be well controlled at this time.  She is to continue Symbicort 160-4.5 MCG 2 puffs twice daily with as needed albuterol.  She has been instructed to contact our office if she is using her albuterol inhaler 3-4 times per day on a consistent basis.  She has continued on Symbicort through their patient assistance program and we will continue to help her with the paperwork in the future in order for her to get this medication.  She is to continue on fluticasone and Claritin for her rhinitis.  Follow-up in 6 months.  Melody Comas, MD [facility] Office: [phone]   Current Outpatient Medications:    albuterol (PROVENTIL) (2.5 MG/3ML) 0.083% nebulizer solution, Take 3 mLs (2.5 mg total) by nebulization every 6 (six) hours. DX: J45.909, Disp: 360 mL, Rfl: 11   albuterol (VENTOLIN HFA) 108 (90 Base) MCG/ACT inhaler, Inhale 2 puffs  into the lungs every 4 (four) hours as needed for wheezing or shortness of breath., Disp: 7 g, Rfl: 3   Albuterol Sulfate, sensor, (PROAIR DIGIHALER) 108 (90 Base) MCG/ACT AEPB, Inhale 0.65 g into the lungs daily., Disp: 200 each, Rfl: 0   atorvastatin (LIPITOR) 20 MG tablet, 1 tablet, Disp: , Rfl:    Biotin 1000 MCG tablet, Take 1,000 mcg by mouth 3 (three) times daily., Disp: , Rfl:    budesonide-formoterol (SYMBICORT) 160-4.5 MCG/ACT inhaler, Inhale 2 puffs into the lungs 2 (two) times daily., Disp: 10.2 g, Rfl: 11   lisinopril (ZESTRIL) 10 MG tablet, Take 1 tablet by mouth daily., Disp: , Rfl:    loratadine (CLARITIN) 10 MG tablet, Take 10 mg by mouth daily., Disp: , Rfl:    metFORMIN (GLUCOPHAGE) 500 MG tablet, 1 tablet with a meal, Disp: , Rfl:    Multiple Vitamin (MULTIVITAMIN WITH MINERALS) TABS tablet, Take 1 tablet by mouth daily., Disp: , Rfl:    omega-3 acid ethyl esters (LOVAZA) 1 G capsule, Take 1 capsule by mouth 2 (two) times daily., Disp: , Rfl:    Spacer/Aero-Holding Chambers (AEROCHAMBER MV) inhaler, Use as instructed, Disp: 1 each, Rfl: 0

## 2020-09-29 NOTE — Patient Instructions (Addendum)
Continue to use Symbicort 2 puffs twice daily -Rinse your mouth out after each use  Use albuterol inhaler 1 to 2 puffs every 4-6 hours as needed for cough, shortness of breath, wheezing or chest tightness.   Please give our office a call if you notice increased use of albuterol 3-4 times per day on a consistent basis.  Continue to use fluticasone nasal spray and Claritin for rhinitis/runny nose.

## 2020-10-06 ENCOUNTER — Ambulatory Visit: Payer: Medicare Other

## 2020-10-19 ENCOUNTER — Other Ambulatory Visit: Payer: Self-pay

## 2020-10-19 ENCOUNTER — Ambulatory Visit
Admission: RE | Admit: 2020-10-19 | Discharge: 2020-10-19 | Disposition: A | Discharge status: Home / Self Care | Payer: Medicare Other | Source: Ambulatory Visit | Attending: Internal Medicine | Admitting: Internal Medicine

## 2020-10-19 DIAGNOSIS — Z1231 Encounter for screening mammogram for malignant neoplasm of breast: Secondary | ICD-10-CM

## 2020-11-02 ENCOUNTER — Ambulatory Visit (HOSPITAL_COMMUNITY)
Admission: RE | Admit: 2020-11-02 | Discharge: 2020-11-02 | Disposition: A | Discharge status: Home / Self Care | Payer: Medicare Other | Source: Ambulatory Visit | Attending: Vascular Surgery | Admitting: Vascular Surgery

## 2020-11-02 ENCOUNTER — Other Ambulatory Visit: Payer: Self-pay

## 2020-11-02 ENCOUNTER — Ambulatory Visit (INDEPENDENT_AMBULATORY_CARE_PROVIDER_SITE_OTHER): Payer: Medicare Other | Admitting: Physician Assistant

## 2020-11-02 VITALS — BP 107/63 | HR 78 | Temp 97.6°F | Resp 14 | Ht 64.0 in

## 2020-11-02 DIAGNOSIS — I739 Peripheral vascular disease, unspecified: Secondary | ICD-10-CM | POA: Insufficient documentation

## 2020-11-02 NOTE — Progress Notes (Signed)
Peripheral Arterial Disease Follow-Up   VASCULAR SURGERY ASSESSMENT & PLAN:   Andrea Townsend is a 60 y.o. female seen by Dr. Randie Heinz for LE wounds. She has known right EIA occlusion.  Stable peripheral arterial disease.  No significant change in ABIs as compared to 6 months ago.  Patient is rest pain. She does not ambulate. Ulcer dry. Continue optimal medical management of diabetes, hypertension and follow-up with primary care physician. Encouraged continuation of complete smoking cessation. Continue the following medications: statin Follow-up in 6 months with ABIs.  SUBJECTIVE:   The patient denies lower extremity pain with exercise or rest pain.  Denies skin loss or ulceration. She is wheelchair-bound secondary to prior stroke with right-sided weakness.  Husband accompanies her today.  They have been treating the ulcer with mupirocin.  PHYSICAL EXAM:   Vitals:   11/02/20 1050  BP: 107/63  Pulse: 78  Resp: 14  Temp: 97.6 F (36.4 C)  SpO2: 98%     General appearance: Well-developed, well-nourished in no apparent distress Neurologic: Alert and oriented x 4. Cardiovascular: Heart rate and rhythm are regular.   Extremities: Skin intact.  Both feet are warm and well perfused.  Decreased motor function on right. Left foot motor function and sensation intact Skin: 2cm hyperpigmented posterior calf ulcer with hypopigmented center. Non tender. No drainage, erythema or increased warmth.      NON-INVASIVE VASCULAR STUDIES   11/02/2020 ABI/TBIToday's ABIToday's TBIPrevious ABIPrevious TBI  +-------+-----------+-----------+------------+------------+  Right  0.74       0.47       0.64        0.48          +-------+-----------+-----------+------------+------------+  Left   1.20       0.79       1.16        0.84          +-------+-----------+-----------+------------+------------+  Right great toe pressure 59.  Waveforms are biphasic  Left great toe pressure 99.   Waveforms are triphasic     Right ABIs appear increased compared to prior study on 04/01/2020. Left  ABIs appear essentially unchanged compared to prior study on 04/01/2020.     Summary:  Right: Resting right ankle-brachial index indicates moderate right lower  extremity arterial disease. The right toe-brachial index is abnormal.   Left: Resting left ankle-brachial index is within normal range. No  evidence of significant left lower extremity arterial disease. The left  toe-brachial index is normal.     *See table(s) above for measurements and observations.      Preliminary    PROBLEM LIST:    The patient's past medical history, past surgical history, family history, social history, allergy list and medication list are reviewed.  She has history of hypertension and diabetes.  She is on a daily statin.   CURRENT MEDS:    Current Outpatient Medications:    albuterol (PROVENTIL) (2.5 MG/3ML) 0.083% nebulizer solution, Take 3 mLs (2.5 mg total) by nebulization every 6 (six) hours. DX: J45.909, Disp: 360 mL, Rfl: 11   albuterol (VENTOLIN HFA) 108 (90 Base) MCG/ACT inhaler, Inhale 2 puffs into the lungs every 4 (four) hours as needed for wheezing or shortness of breath., Disp: 7 g, Rfl: 3   Albuterol Sulfate, sensor, (PROAIR DIGIHALER) 108 (90 Base) MCG/ACT AEPB, Inhale 0.65 g into the lungs daily., Disp: 200 each, Rfl: 0   atorvastatin (LIPITOR) 20 MG tablet, 1 tablet, Disp: , Rfl:    Biotin 1000 MCG tablet, Take 1,000  mcg by mouth 3 (three) times daily., Disp: , Rfl:    budesonide-formoterol (SYMBICORT) 160-4.5 MCG/ACT inhaler, Inhale 2 puffs into the lungs 2 (two) times daily., Disp: 10.2 g, Rfl: 11   lisinopril (ZESTRIL) 10 MG tablet, Take 1 tablet by mouth daily., Disp: , Rfl:    loratadine (CLARITIN) 10 MG tablet, Take 10 mg by mouth daily., Disp: , Rfl:    metFORMIN (GLUCOPHAGE) 500 MG tablet, 1 tablet with a meal, Disp: , Rfl:    Multiple Vitamin (MULTIVITAMIN WITH MINERALS)  TABS tablet, Take 1 tablet by mouth daily., Disp: , Rfl:    omega-3 acid ethyl esters (LOVAZA) 1 G capsule, Take 1 capsule by mouth 2 (two) times daily., Disp: , Rfl:    Spacer/Aero-Holding Chambers (AEROCHAMBER MV) inhaler, Use as instructed, Disp: 1 each, Rfl: 0   REVIEW OF SYSTEMS:   [X]  denotes positive finding, [ ]  denotes negative finding Cardiac  Comments:  Chest pain or chest pressure:    Shortness of breath upon exertion:    Short of breath when lying flat:    Irregular heart rhythm:        Vascular    Pain in calf, thigh, or hip brought on by ambulation:    Pain in feet at night that wakes you up from your sleep:     Blood clot in your veins:    Leg swelling:         Pulmonary    Oxygen at home:    Productive cough:     Wheezing:         Neurologic    Sudden weakness in arms or legs:     Sudden numbness in arms or legs:     Sudden onset of difficulty speaking or slurred speech:    Temporary loss of vision in one eye:     Problems with dizziness:         Gastrointestinal    Blood in stool:     Vomited blood:         Genitourinary    Burning when urinating:     Blood in urine:        Psychiatric    Major depression:         Hematologic    Bleeding problems:    Problems with blood clotting too easily:        Skin    Rashes or ulcers:        Constitutional    Fever or chills:     , PA-C  Office: [phone] 11/02/2020

## 2020-11-03 ENCOUNTER — Other Ambulatory Visit: Payer: Self-pay

## 2020-11-03 DIAGNOSIS — I739 Peripheral vascular disease, unspecified: Secondary | ICD-10-CM

## 2020-11-24 MED ORDER — BUDESONIDE-FORMOTEROL FUMARATE 160-4.5 MCG/ACT IN AERO: 2.0000 | INHALATION_SPRAY | Freq: Two times a day (BID) | RESPIRATORY_TRACT | 3 refills | Status: DC

## 2020-11-24 NOTE — Addendum Note (Signed)
Addended by: Charlott Holler on: 11/24/2020 12:06 PM   Modules accepted: Orders

## 2020-12-08 NOTE — Progress Notes (Unsigned)
Opened in error

## 2020-12-31 DIAGNOSIS — Z23 Encounter for immunization: Secondary | ICD-10-CM | POA: Diagnosis not present

## 2021-01-31 DIAGNOSIS — I10 Essential (primary) hypertension: Secondary | ICD-10-CM | POA: Diagnosis not present

## 2021-01-31 DIAGNOSIS — Z03818 Encounter for observation for suspected exposure to other biological agents ruled out: Secondary | ICD-10-CM | POA: Diagnosis not present

## 2021-01-31 DIAGNOSIS — I69320 Aphasia following cerebral infarction: Secondary | ICD-10-CM | POA: Diagnosis not present

## 2021-01-31 DIAGNOSIS — I69351 Hemiplegia and hemiparesis following cerebral infarction affecting right dominant side: Secondary | ICD-10-CM | POA: Diagnosis not present

## 2021-01-31 DIAGNOSIS — J069 Acute upper respiratory infection, unspecified: Secondary | ICD-10-CM | POA: Diagnosis not present

## 2021-03-21 ENCOUNTER — Other Ambulatory Visit (HOSPITAL_COMMUNITY)
Admission: RE | Admit: 2021-03-21 | Discharge: 2021-03-21 | Disposition: A | Discharge status: Home / Self Care | Payer: Medicare Other | Source: Ambulatory Visit | Attending: Obstetrics and Gynecology | Admitting: Obstetrics and Gynecology

## 2021-03-21 ENCOUNTER — Other Ambulatory Visit: Payer: Self-pay | Admitting: Obstetrics and Gynecology

## 2021-03-21 DIAGNOSIS — Z01419 Encounter for gynecological examination (general) (routine) without abnormal findings: Secondary | ICD-10-CM | POA: Diagnosis not present

## 2021-03-21 DIAGNOSIS — Z1151 Encounter for screening for human papillomavirus (HPV): Secondary | ICD-10-CM | POA: Diagnosis not present

## 2021-03-23 LAB — CYTOLOGY - PAP
Adequacy: ABSENT
Comment: NEGATIVE
Diagnosis: NEGATIVE
Diagnosis: REACTIVE
High risk HPV: NEGATIVE

## 2021-04-19 ENCOUNTER — Ambulatory Visit (INDEPENDENT_AMBULATORY_CARE_PROVIDER_SITE_OTHER): Payer: Medicare Other

## 2021-04-19 ENCOUNTER — Ambulatory Visit (INDEPENDENT_AMBULATORY_CARE_PROVIDER_SITE_OTHER): Payer: Medicare Other | Admitting: Student

## 2021-04-19 ENCOUNTER — Encounter: Payer: Self-pay | Admitting: Student

## 2021-04-19 ENCOUNTER — Other Ambulatory Visit: Payer: Self-pay

## 2021-04-19 VITALS — BP 112/68 | HR 73 | Temp 97.6°F | Ht 64.0 in | Wt 220.0 lb

## 2021-04-19 DIAGNOSIS — R0600 Dyspnea, unspecified: Secondary | ICD-10-CM

## 2021-04-19 DIAGNOSIS — B9689 Other specified bacterial agents as the cause of diseases classified elsewhere: Secondary | ICD-10-CM | POA: Diagnosis not present

## 2021-04-19 DIAGNOSIS — R059 Cough, unspecified: Secondary | ICD-10-CM | POA: Diagnosis not present

## 2021-04-19 DIAGNOSIS — J019 Acute sinusitis, unspecified: Secondary | ICD-10-CM | POA: Diagnosis not present

## 2021-04-19 MED ORDER — BREZTRI AEROSPHERE 160-9-4.8 MCG/ACT IN AERO: 2.0000 | INHALATION_SPRAY | Freq: Two times a day (BID) | RESPIRATORY_TRACT | 0 refills | Status: DC

## 2021-04-19 MED ORDER — AMOXICILLIN-POT CLAVULANATE 875-125 MG PO TABS: 1.0000 | ORAL_TABLET | Freq: Two times a day (BID) | ORAL | 0 refills | Status: AC

## 2021-04-19 NOTE — Progress Notes (Signed)
? ?Synopsis: Referred for dyspnea, cough by Leeroy Cha,* ? ?Subjective:  ? ?PATIENT ID: Andrea Townsend GENDER: female DOB: Aug 26, 1960, MRN: [id number] ? ?Chief Complaint  ?Patient presents with  ? Acute visit   ?  Pt c/o increased SOB and cough for the past several wks. She has had prod cough with green to yellow sputum. She has also had some wheezing. She has been using her proair respiclick several times per day and neb at least once per day.   ? ?61yF with history of smoking, moderate persistent asthma, allergic rhinitis followed by Dr. Erin Fulling, CVA with aphasia and R hemiparesis who is seen for acute visit for cough and dyspnea. ? ?Has been having worse dyspnea, cough over last month, cough that is productive of greenish mucus. Some chest tightness. Still using symbicort 2 puffs twice daily with spacer, rinsing mouth. No fever. She does have sinus congestion. She does kind of feel like it could be allergy but husband thinks this started before that. Does have postnasal drainage.  ? ?She has not been hospitalized since last visit and hasn't needed any courses of steroids.  ? ?Otherwise pertinent review of systems is negative. ? ?Past Medical History:  ?Diagnosis Date  ? Bronchitis   ? Diabetes mellitus without complication ([place])   ? Hyperlipidemia   ? Hypertension   ? Stroke [facility])   ?  ? ?Family History  ?Problem Relation Age of Onset  ? Asthma Mother   ?     hospitalization with exacerbation  ? Asthma Sister   ?     hospitalization with exacerbation  ?  ? ?Past Surgical History:  ?Procedure Laterality Date  ? ABDOMINAL SURGERY    ? CESAREAN SECTION    ? ? ?Social History  ? ?Socioeconomic History  ? Marital status: Married  ?  Spouse name: Not on file  ? Number of children: Not on file  ? Years of education: Not on file  ? Highest education level: Not on file  ?Occupational History  ? Not on file  ?Tobacco Use  ? Smoking status: Former  ?  Packs/day: 1.00  ?  Years: 20.00  ?  Pack years: 20.00  ?   Types: Cigarettes  ?  Quit date: 09/22/2006  ?  Years since quitting: 14.5  ? Smokeless tobacco: Never  ?Vaping Use  ? Vaping Use: Never used  ?Substance and Sexual Activity  ? Alcohol use: No  ? Drug use: No  ? Sexual activity: Not on file  ?Other Topics Concern  ? Not on file  ?Social History Narrative  ? Not on file  ? ?Social Determinants of Health  ? ?Financial Resource Strain: Not on file  ?Food Insecurity: Not on file  ?Transportation Needs: Not on file  ?Physical Activity: Not on file  ?Stress: Not on file  ?Social Connections: Not on file  ?Intimate Partner Violence: Not on file  ?  ? ?No Known Allergies  ? ?Outpatient Medications Prior to Visit  ?Medication Sig Dispense Refill  ? albuterol (PROVENTIL) (2.5 MG/3ML) 0.083% nebulizer solution Take 3 mLs (2.5 mg total) by nebulization every 6 (six) hours. DX: J45.909 360 mL 11  ? albuterol (VENTOLIN HFA) 108 (90 Base) MCG/ACT inhaler Inhale 2 puffs into the lungs every 4 (four) hours as needed for wheezing or shortness of breath. 7 g 3  ? Albuterol Sulfate, sensor, (PROAIR DIGIHALER) 108 (90 Base) MCG/ACT AEPB Inhale 0.65 g into the lungs daily. 200 each 0  ? atorvastatin (LIPITOR)  20 MG tablet 1 tablet    ? Biotin 1000 MCG tablet Take 1,000 mcg by mouth 3 (three) times daily.    ? budesonide-formoterol (SYMBICORT) 160-4.5 MCG/ACT inhaler Inhale 2 puffs into the lungs 2 (two) times daily. 3 each 3  ? lisinopril (ZESTRIL) 10 MG tablet Take 1 tablet by mouth daily.    ? loratadine (CLARITIN) 10 MG tablet Take 10 mg by mouth daily.    ? metFORMIN (GLUCOPHAGE) 500 MG tablet 1 tablet with a meal    ? Multiple Vitamin (MULTIVITAMIN WITH MINERALS) TABS tablet Take 1 tablet by mouth daily.    ? omega-3 acid ethyl esters (LOVAZA) 1 G capsule Take 1 capsule by mouth 2 (two) times daily.    ? Spacer/Aero-Holding Chambers (AEROCHAMBER MV) inhaler Use as instructed 1 each 0  ? budesonide-formoterol (SYMBICORT) 160-4.5 MCG/ACT inhaler Inhale 2 puffs into the lungs 2 (two)  times daily. 10.2 g 11  ? ?No facility-administered medications prior to visit.  ? ? ? ? ? ?Objective:  ? ?Physical Exam: ? ?General appearance: 61 y.o., female, NAD, conversant  ?Eyes: anicteric sclerae; PERRL, tracking appropriately ?HENT: NCAT; MMM - purulent drainage over inferior turbinates bl with maxillary sinus tenderness ?Neck: Trachea midline; no lymphadenopathy, no JVD ?Lungs: CTAB, no crackles, no wheeze, with normal respiratory effort ?CV: RRR, no murmur  ?Abdomen: Soft, non-tender; non-distended, BS present  ?Extremities: No peripheral edema, warm ?Skin: Normal turgor and texture; no rash ?Psych: Appropriate affect ? ? ? ?Vitals:  ? 04/19/21 1637  ?BP: 112/68  ?Pulse: 73  ?Temp: 97.6 ?F (36.4 ?C)  ?TempSrc: Oral  ?SpO2: 98%  ?Weight: 220 lb (99.8 kg)  ?Height: 5\' 4"  (1.626 m)  ? ?98% on RA ?BMI Readings from Last 3 Encounters:  ?04/19/21 37.76 kg/m?  ?11/02/20 40.47 kg/m?  ?09/29/20 43.13 kg/m?  ? ?Wt Readings from Last 3 Encounters:  ?04/19/21 220 lb (99.8 kg)  ?09/29/20 235 lb 12.8 oz (107 kg)  ?04/01/20 230 lb (104.3 kg)  ? ? ? ?CBC ?   ?Component Value Date/Time  ? WBC 7.2 04/13/2014 1654  ? RBC 4.64 04/13/2014 1654  ? HGB 14.3 04/13/2014 1654  ? HCT 43.3 04/13/2014 1654  ? PLT 323.0 04/13/2014 1654  ? MCV 93.2 04/13/2014 1654  ? MCH 31.1 09/21/2013 0248  ? MCHC 33.1 04/13/2014 1654  ? RDW 15.1 04/13/2014 1654  ? LYMPHSABS 2.2 04/13/2014 1654  ? MONOABS 0.3 04/13/2014 1654  ? EOSABS 0.5 04/13/2014 1654  ? BASOSABS 0.0 04/13/2014 1654  ? ? ? ?Chest Imaging: ?None new ? ?Pulmonary Functions Testing Results: ?No flowsheet data found. ? ? ?TTE 2015: ?G1DD ?   ?Assessment & Plan:  ? ?# Acute bacterial sinusitis ? ?# Dyspnea ?# Subacute cough ?# Moderate persistent asthma, not in exacerbation ?The subacute cough is probably related to sinusitis but together with dyspnea may reflect worsened asthma control in setting of acute bacterial sinusitis but not wheezy on exam vs deconditioning, diastolic  dysfunction but looks well compensated today ? ?Plan: ?- augmentin BID for 7d course ?- trial breztri 2 puffs BID with spacer, rinse after use, reinforced technique - 2 samples given ?- albuterol prn ?- they'll keep appointment with Dr. Erin Fulling 3/13 but if doing much better may postpone for 6 week follow up before breztri runs out. I'd order pre/post BD spirometry if she shedules 6 week follow up otherwise defer to Dr. Erin Fulling at next clinic visit ? ? ? ? ?Maryjane Hurter, MD ?[facility] ?  04/19/2021 5:12 PM  ? ?

## 2021-04-19 NOTE — Patient Instructions (Signed)
-   chest x ray today will call if anything crazy on it that warrants a change in our treatment approach ?- start augmentin 1 tablet twice daily for 7 days for sinusitis ?- stop symbicort and start breztri 2 puffs twice daily with spacer, rinse mouth after use ?- can keep appt with Dr. Francine Graven on 3/13 but if doing way better can postpone to 6 weeks before you run out of breztri ?

## 2021-04-25 ENCOUNTER — Other Ambulatory Visit: Payer: Self-pay

## 2021-04-25 DIAGNOSIS — E669 Obesity, unspecified: Secondary | ICD-10-CM | POA: Insufficient documentation

## 2021-04-25 DIAGNOSIS — Z8673 Personal history of transient ischemic attack (TIA), and cerebral infarction without residual deficits: Secondary | ICD-10-CM | POA: Insufficient documentation

## 2021-04-25 DIAGNOSIS — J301 Allergic rhinitis due to pollen: Secondary | ICD-10-CM | POA: Insufficient documentation

## 2021-04-25 DIAGNOSIS — F411 Generalized anxiety disorder: Secondary | ICD-10-CM | POA: Insufficient documentation

## 2021-04-25 DIAGNOSIS — R9389 Abnormal findings on diagnostic imaging of other specified body structures: Secondary | ICD-10-CM | POA: Insufficient documentation

## 2021-04-25 DIAGNOSIS — I69959 Hemiplegia and hemiparesis following unspecified cerebrovascular disease affecting unspecified side: Secondary | ICD-10-CM | POA: Insufficient documentation

## 2021-04-25 DIAGNOSIS — I83011 Varicose veins of right lower extremity with ulcer of thigh: Secondary | ICD-10-CM | POA: Insufficient documentation

## 2021-04-25 DIAGNOSIS — N3941 Urge incontinence: Secondary | ICD-10-CM | POA: Insufficient documentation

## 2021-04-25 DIAGNOSIS — N951 Menopausal and female climacteric states: Secondary | ICD-10-CM | POA: Insufficient documentation

## 2021-04-25 DIAGNOSIS — L97109 Non-pressure chronic ulcer of unspecified thigh with unspecified severity: Secondary | ICD-10-CM | POA: Insufficient documentation

## 2021-04-25 DIAGNOSIS — I10 Essential (primary) hypertension: Secondary | ICD-10-CM | POA: Insufficient documentation

## 2021-04-25 DIAGNOSIS — I739 Peripheral vascular disease, unspecified: Secondary | ICD-10-CM | POA: Insufficient documentation

## 2021-04-25 DIAGNOSIS — I69320 Aphasia following cerebral infarction: Secondary | ICD-10-CM | POA: Insufficient documentation

## 2021-05-01 ENCOUNTER — Ambulatory Visit: Payer: Medicare Other | Admitting: Pulmonary Disease

## 2021-05-09 NOTE — Progress Notes (Signed)
[facility] [place] ?HISTORY AND PHYSICAL  ? ?History of Present Illness:  Patient is a 61 y.o. year old female who presents for evaluation of B LE with history of wounds on the right LE.   She has known right EIA occlusion.  Previous history of stroke with paralysis of her right upper and lower extremity and she is aphasic. Her husband is with her and is her primary care giver.  ? She is very pleasant and well dressed today.  She denies new symptoms of rest pain, or non healing wounds.  She is not very ambulatory since her stroke and has no symptoms  of claudication.  Her primary mode of transportation is a WC.   ?  ?  ?   ?Past Medical History:  ?Diagnosis Date  ? Bronchitis   ? Diabetes mellitus without complication (H[facility])   ? Hyperlipidemia   ? Hypertension   ? Stroke [facility])   ? ? ?Past Surgical History:  ?Procedure Laterality Date  ? ABDOMINAL SURGERY    ? CESAREAN SECTION    ? ? ?ROS:  ? ?General:  No weight loss, Fever, chills ? ?HEENT: No recent headaches, no nasal bleeding, no visual changes, no sore throat ? ?Neurologic: No dizziness, blackouts, seizures. No recent symptoms of stroke or mini- stroke. No recent episodes of slurred speech, or temporary blindness. ? ?Cardiac: No recent episodes of chest pain/pressure, no shortness of breath at rest.  No shortness of breath with exertion.  Denies history of atrial fibrillation or irregular heartbeat ? ?Vascular: No history of rest pain in feet.  No history of claudication.  No history of non-healing ulcer, No history of DVT  ? ?Pulmonary: No home oxygen, no productive cough, no hemoptysis,  No asthma or wheezing ? ?Musculoskeletal:  [ ]  Arthritis, [ ]  Low back pain,  [ ]  Joint pain ? ?Hematologic:No history of hypercoagulable state.  No history of easy bleeding.  No history of anemia ? ?Gastrointestinal: No hematochezia or melena,  No gastroesophageal reflux, no trouble swallowing ? ?Urinary: [ ]  chronic Kidney disease, [ ]  on HD - [ ]  MWF  or [ ]  TTHS, [ ]  Burning with urination, [ ]  Frequent urination, [ ]  Difficulty urinating;  ? ?Skin: No rashes ? ?Psychological: No history of anxiety,  No history of depression ? ?Social History ?Social History  ? ?Tobacco Use  ? Smoking status: Former  ?  Packs/day: 1.00  ?  Years: 20.00  ?  Pack years: 20.00  ?  Types: Cigarettes  ?  Quit date: 09/22/2006  ?  Years since quitting: 14.6  ? Smokeless tobacco: Never  ?Vaping Use  ? Vaping Use: Never used  ?Substance Use Topics  ? Alcohol use: No  ? Drug use: No  ? ? ?Family History ?Family History  ?Problem Relation Age of Onset  ? Asthma Mother   ?     hospitalization with exacerbation  ? Asthma Sister   ?     hospitalization with exacerbation  ? ? ?Allergies ? ?No Known Allergies ? ? ?Current Outpatient Medications  ?Medication Sig Dispense Refill  ? albuterol (PROVENTIL) (2.5 MG/3ML) 0.083% nebulizer solution Take 3 mLs (2.5 mg total) by nebulization every 6 (six) hours. DX: J45.909 360 mL 11  ? albuterol (VENTOLIN HFA) 108 (90 Base) MCG/ACT inhaler Inhale 2 puffs into the lungs every 4 (four) hours as needed for wheezing or shortness of breath. 7 g 3  ? Albuterol Sulfate, sensor, (PROAIR DIGIHALER) 108 (  90 Base) MCG/ACT AEPB Inhale 0.65 g into the lungs daily. 200 each 0  ? atorvastatin (LIPITOR) 20 MG tablet 1 tablet    ? Biotin 1000 MCG tablet Take 1,000 mcg by mouth 3 (three) times daily.    ? Budeson-Glycopyrrol-Formoterol (BREZTRI AEROSPHERE) 160-9-4.8 MCG/ACT AERO Inhale 2 puffs into the lungs in the morning and at bedtime. 5.9 g 0  ? budesonide-formoterol (SYMBICORT) 160-4.5 MCG/ACT inhaler Inhale 2 puffs into the lungs 2 (two) times daily. 3 each 3  ? lisinopril (ZESTRIL) 10 MG tablet Take 1 tablet by mouth daily.    ? loratadine (CLARITIN) 10 MG tablet Take 10 mg by mouth daily.    ? metFORMIN (GLUCOPHAGE) 500 MG tablet 1 tablet with a meal    ? Multiple Vitamin (MULTIVITAMIN WITH MINERALS) TABS tablet Take 1 tablet by mouth daily.    ?  Spacer/Aero-Holding Chambers (AEROCHAMBER MV) inhaler Use as instructed 1 each 0  ? omega-3 acid ethyl esters (LOVAZA) 1 G capsule Take 1 capsule by mouth 2 (two) times daily. (Patient not taking: Reported on 05/10/2021)    ? ?No current facility-administered medications for this visit.  ? ? ?Physical Examination ? ?Vitals:  ? 05/10/21 1346  ?BP: 111/77  ?Pulse: 81  ?Temp: (!) 96.7 ?F (35.9 ?C)  ?Height: 5\' 4"  (1.626 m)  ? ? ?Body mass index is 37.76 kg/m?. ? ?General:  Alert and oriented, no acute distress ?HEENT: Normal ?Neck: No bruit or JVD ?Pulmonary: Clear to auscultation bilaterally ?Cardiac: Regular Rate and Rhythm without murmur ?Abdomen: Soft, non-tender, non-distended, no mass, no scars ?Skin: No rash ?Musculoskeletal: No deformity or edema.  Skin without ischemic changes. ?Neurologic: left Upper and lower extremity motor 5/5 right paraplegia  ? ?DATA:  ? ?   ?ABI Findings:  ?+---------+------------------+-----+----------+----------------------------  ?----+  ?Right    Rt Pressure (mmHg)IndexWaveform  Comment                      ?      ?+---------+------------------+-----+----------+----------------------------  ?----+  ?Brachial                                  per patient - only take  ?left      ?                                          brachial pressure            ?      ?+---------+------------------+-----+----------+----------------------------  ?----+  ?PTA      86                0.66 monophasic                              ?      ?+---------+------------------+-----+----------+----------------------------  ?----+  ?DP       76                0.58 monophasic                              ?      ?+---------+------------------+-----+----------+----------------------------  ?----+  ?Great [phone]  0.47                                         ?      ?+---------+------------------+-----+----------+----------------------------  ?----+   ? ?+---------+------------------+-----+---------+-------+  ?Left     Lt Pressure (mmHg)IndexWaveform Comment  ?+---------+------------------+-----+---------+-------+  ?Brachial 131                                      ?+---------+------------------+-----+---------+-------+  ?PTA      147               1.12 triphasic         ?+---------+------------------+-----+---------+-------+  ?DP       138               1.05 biphasic          ?+---------+------------------+-----+---------+-------+  ?Great Toe93                0.71                   ?+---------+------------------+-----+---------+-------+  ? ?+-------+-----------+-----------+------------+------------+  ?ABI/TBIToday's ABIToday's TBIPrevious ABIPrevious TBI  ?+-------+-----------+-----------+------------+------------+  ?Right  0.66       0.47       0.74        0.47          ?+-------+-----------+-----------+------------+------------+  ?Left   1.12       0.71       1.20        0.79          ?+-------+-----------+-----------+------------+------------+  ? ? ?Summary:  ?Right: Resting right ankle-brachial index indicates moderate right lower  ?extremity arterial disease. The right toe-brachial index is abnormal.  ? ?Left: Resting left ankle-brachial index is within normal range. No  ?evidence of significant left lower extremity arterial disease. The left  ?toe-brachial index is normal.  ? 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  The wound has fully healed.  She denies symptoms of non healing wound, rest pain or claudication.  She is not ambulatory. ?The ABI's have not changed significantly.  Her TBI is stable and the ABI is 66%.   ? She will f/u in 6 months for repeat ABI's.  If she develops new symptoms or has concerns she will call.  Her Husband is her primary provider and wishes to accompany her to her ultrasound studies as well for communications assistance.   ?  ? ?Mosetta Pigeon ?PA-C ?[facility] [place] ?Office: [phone] ? ?MD in clinic [place] ?

## 2021-05-10 ENCOUNTER — Other Ambulatory Visit: Payer: Self-pay

## 2021-05-10 ENCOUNTER — Ambulatory Visit: Payer: Medicare Other | Admitting: Physician Assistant

## 2021-05-10 ENCOUNTER — Ambulatory Visit (HOSPITAL_COMMUNITY)
Admission: RE | Admit: 2021-05-10 | Discharge: 2021-05-10 | Disposition: A | Discharge status: Home / Self Care | Payer: Medicare Other | Source: Ambulatory Visit | Attending: Vascular Surgery | Admitting: Vascular Surgery

## 2021-05-10 ENCOUNTER — Encounter: Payer: Self-pay | Admitting: Physician Assistant

## 2021-05-10 VITALS — BP 111/77 | HR 81 | Temp 96.7°F | Ht 64.0 in

## 2021-05-10 DIAGNOSIS — I739 Peripheral vascular disease, unspecified: Secondary | ICD-10-CM | POA: Insufficient documentation

## 2021-05-16 ENCOUNTER — Other Ambulatory Visit: Payer: Self-pay | Admitting: *Deleted

## 2021-05-16 DIAGNOSIS — I739 Peripheral vascular disease, unspecified: Secondary | ICD-10-CM

## 2021-06-23 ENCOUNTER — Ambulatory Visit: Payer: Medicare Other | Admitting: Nurse Practitioner

## 2021-06-23 ENCOUNTER — Encounter: Payer: Self-pay | Admitting: Nurse Practitioner

## 2021-06-23 ENCOUNTER — Ambulatory Visit (INDEPENDENT_AMBULATORY_CARE_PROVIDER_SITE_OTHER): Payer: Medicare Other

## 2021-06-23 VITALS — BP 128/86 | HR 91 | Temp 98.5°F

## 2021-06-23 DIAGNOSIS — J4541 Moderate persistent asthma with (acute) exacerbation: Secondary | ICD-10-CM | POA: Diagnosis not present

## 2021-06-23 DIAGNOSIS — J309 Allergic rhinitis, unspecified: Secondary | ICD-10-CM

## 2021-06-23 DIAGNOSIS — R059 Cough, unspecified: Secondary | ICD-10-CM | POA: Diagnosis not present

## 2021-06-23 MED ORDER — BENZONATATE 200 MG PO CAPS: 200.0000 mg | ORAL_CAPSULE | Freq: Three times a day (TID) | ORAL | 1 refills | Status: DC | PRN

## 2021-06-23 MED ORDER — BREZTRI AEROSPHERE 160-9-4.8 MCG/ACT IN AERO: 2.0000 | INHALATION_SPRAY | Freq: Two times a day (BID) | RESPIRATORY_TRACT | 5 refills | Status: DC

## 2021-06-23 MED ORDER — AZITHROMYCIN 250 MG PO TABS: ORAL_TABLET | ORAL | 0 refills | Status: DC

## 2021-06-23 MED ORDER — LEVOCETIRIZINE DIHYDROCHLORIDE 5 MG PO TABS: 5.0000 mg | ORAL_TABLET | Freq: Every evening | ORAL | 5 refills | Status: DC

## 2021-06-23 MED ORDER — PREDNISONE 10 MG PO TABS: ORAL_TABLET | ORAL | 0 refills | Status: DC

## 2021-06-23 MED ORDER — DOXYCYCLINE HYCLATE 100 MG PO TABS: 100.0000 mg | ORAL_TABLET | Freq: Two times a day (BID) | ORAL | 0 refills | Status: AC

## 2021-06-23 NOTE — Progress Notes (Signed)
? ?@Patient  ID: , female    DOB: 07-Apr-1960, 61 y.o.   MRN: [id number] ? ?Chief Complaint  ?Patient presents with  ? Follow-up  ?  2 weeks of productive cough   ? ? ?Referring provider: ?[id number],* ? ?HPI: ?61 year old female, former smoker (20 pack years) followed for asthma and allergic rhinitis.  She is a patient of Dr. 67 and last seen in office on 04/19/2021 by Dr. 06/19/2021 for acute visit.  Past medical history significant for hypertension, PVD, vocal cord dysfunction, GAD, obesity. ? ?TEST/EVENTS:  ? ?04/19/2021: OV with Dr. 06/19/2021 for acute visit.  Reported worsening dyspnea and cough over the last month.  Cough was productive with greenish sputum.  Also has some associated chest tightness.  She reported using Symbicort 2 puffs twice daily with spacer and rinsing mouth afterwards.  Does have some sinus congestion as well.  Suspected that cough could be related to sinusitis but may reflect worsened asthma control.  No significant wheezing on exam.  Did also consider the fact that she has multiple other factors that could contribute to her DOE including deconditioning, diastolic dysfunction.  Appears compensated on exam though.  Trial step up to Breztri 2 puffs twice a day.  Started on Augmentin course. ? ?06/23/2021: Today-acute visit ?Patient presents today with daughter for return of her cough.  Started back around 2 weeks ago.  Describes cough as productive with yellow sputum production.  She also has increased shortness of breath and some wheezing.  He continues to have occasional nasal congestion/drainage. Takes Claritin for her allergies.  She denies hemoptysis, lower extremity edema, anorexia, recent weight loss, PND.  No recent fevers or sick exposures.  She did previously use Breztri; felt like it might have improved her symptoms some.  She ran out and did not get this refilled so she is back on her Symbicort.  Uses albuterol occasionally. ? ?No Known  Allergies ? ?Immunization History  ?Administered Date(s) Administered  ? Influenza Split 12/30/2019  ? Influenza,inj,Quad PF,6+ Mos 11/10/2014, 10/28/2017, 11/19/2017, 03/25/2019  ? PFIZER(Purple Top)SARS-COV-2 Vaccination 04/20/2019, 05/13/2019, 12/12/2019  ? Pneumococcal Conjugate-13 08/15/2018  ? Tdap 08/15/2018  ? ? ?Past Medical History:  ?Diagnosis Date  ? Bronchitis   ? Diabetes mellitus without complication (H[facility])   ? Hyperlipidemia   ? Hypertension   ? Stroke [facility])   ? ? ?Tobacco History: ?Social History  ? ?Tobacco Use  ?Smoking Status Former  ? Packs/day: 1.00  ? Years: 20.00  ? Pack years: 20.00  ? Types: Cigarettes  ? Quit date: 09/22/2006  ? Years since quitting: 14.7  ?Smokeless Tobacco Never  ? ?Counseling given: Not Answered ? ? ?Outpatient Medications Prior to Visit  ?Medication Sig Dispense Refill  ? albuterol (PROVENTIL) (2.5 MG/3ML) 0.083% nebulizer solution Take 3 mLs (2.5 mg total) by nebulization every 6 (six) hours. DX: J45.909 360 mL 11  ? albuterol (VENTOLIN HFA) 108 (90 Base) MCG/ACT inhaler Inhale 2 puffs into the lungs every 4 (four) hours as needed for wheezing or shortness of breath. 7 g 3  ? Albuterol Sulfate, sensor, (PROAIR DIGIHALER) 108 (90 Base) MCG/ACT AEPB Inhale 0.65 g into the lungs daily. 200 each 0  ? atorvastatin (LIPITOR) 20 MG tablet 1 tablet    ? Biotin 1000 MCG tablet Take 1,000 mcg by mouth 3 (three) times daily.    ? lisinopril (ZESTRIL) 10 MG tablet Take 1 tablet by mouth daily.    ? metFORMIN (GLUCOPHAGE) 500 MG tablet 1 tablet with  a meal    ? Multiple Vitamin (MULTIVITAMIN WITH MINERALS) TABS tablet Take 1 tablet by mouth daily.    ? omega-3 acid ethyl esters (LOVAZA) 1 G capsule Take 1 capsule by mouth 2 (two) times daily.    ? Spacer/Aero-Holding Chambers (AEROCHAMBER MV) inhaler Use as instructed 1 each 0  ? budesonide-formoterol (SYMBICORT) 160-4.5 MCG/ACT inhaler Inhale 2 puffs into the lungs 2 (two) times daily. 3 each 3  ? loratadine (CLARITIN) 10 MG tablet  Take 10 mg by mouth daily.    ? Budeson-Glycopyrrol-Formoterol (BREZTRI AEROSPHERE) 160-9-4.8 MCG/ACT AERO Inhale 2 puffs into the lungs in the morning and at bedtime. (Patient not taking: Reported on 06/23/2021) 5.9 g 0  ? ?No facility-administered medications prior to visit.  ? ? ? ?Review of Systems:  ? ?Constitutional: No weight loss or gain, night sweats, fevers, chills, fatigue, or lassitude. ?HEENT: No headaches, difficulty swallowing, tooth/dental problems, or sore throat. No sneezing, itching, ear ache. +nasal congestion/drainage ?CV:  No chest pain, orthopnea, PND, swelling in lower extremities, anasarca, dizziness, palpitations, syncope ?Resp: +shortness of breath with exertion; productive cough; wheezing. No hemoptysis. No chest wall deformity ?GI:  No heartburn, indigestion, abdominal pain, nausea, vomiting, diarrhea, change in bowel habits, loss of appetite, bloody stools.  ?Skin: No rash, lesions, ulcerations ?MSK:  No joint pain or swelling.  No decreased range of motion.  No back pain. ?Neuro: No dizziness or lightheadedness.  ?Psych: No depression or anxiety. Mood stable.  ? ? ? ?Physical Exam: ? ?BP 128/86   Pulse 91   Temp 98.5 ?F (36.9 ?C) (Oral)   SpO2 97%  ? ?GEN: Pleasant, interactive, well-appearing; in no acute distress. ?HEENT:  Normocephalic and atraumatic. PERRLA. Sclera white. Nasal turbinates erythematous, moist and patent bilaterally. Clear rhinorrhea present. Oropharynx pink and moist, without exudate or edema. No lesions, ulcerations, or postnasal drip.  ?NECK:  Supple w/ fair ROM. No JVD present. Normal carotid impulses w/o bruits. Thyroid symmetrical with no goiter or nodules palpated. No lymphadenopathy.   ?CV: RRR, no m/r/g, no peripheral edema. Pulses intact, +2 bilaterally. No cyanosis, pallor or clubbing. ?PULMONARY:  Unlabored, regular breathing. Scattered end expiratory wheezes bilaterally A&P. No accessory muscle use. No dullness to percussion. ?GI: BS present and  normoactive. Soft, non-tender to palpation. No organomegaly or masses detected. No CVA tenderness. ?MSK: No erythema, warmth or tenderness. Cap refil <2 sec all extrem. No deformities or joint swelling noted.  ?Neuro: A/Ox3. No focal deficits noted.   ?Skin: Warm, no lesions or rashe ?Psych: Normal affect and behavior. Judgement and thought content appropriate.  ? ? ? ?Lab Results: ? ?CBC ?   ?Component Value Date/Time  ? WBC 7.2 04/13/2014 1654  ? RBC 4.64 04/13/2014 1654  ? HGB 14.3 04/13/2014 1654  ? HCT 43.3 04/13/2014 1654  ? PLT 323.0 04/13/2014 1654  ? MCV 93.2 04/13/2014 1654  ? MCH 31.1 09/21/2013 0248  ? MCHC 33.1 04/13/2014 1654  ? RDW 15.1 04/13/2014 1654  ? LYMPHSABS 2.2 04/13/2014 1654  ? MONOABS 0.3 04/13/2014 1654  ? EOSABS 0.5 04/13/2014 1654  ? BASOSABS 0.0 04/13/2014 1654  ? ? ?BMET ?   ?Component Value Date/Time  ? NA 141 09/21/2013 0257  ? K 4.0 09/21/2013 0257  ? CL 107 09/21/2013 0257  ? CO2 21 08/17/2013 1408  ? GLUCOSE 105 (H) 09/21/2013 0257  ? BUN 11 09/21/2013 0257  ? CREATININE 0.90 09/21/2013 0257  ? CALCIUM 9.3 08/17/2013 1408  ? GFRNONAA >90 08/17/2013 1408  ?  GFRAA >90 08/17/2013 1408  ? ? ?BNP ?No results found for: BNP ? ? ?Imaging: ? ?DG Chest 2 View ? ?Result Date: 06/23/2021 ?CLINICAL DATA:  Productive cough EXAM: CHEST - 2 VIEW COMPARISON:  Radiograph 04/19/2021 FINDINGS: Unchanged cardiomediastinal silhouette. There is no focal airspace disease. There is no pleural effusion. No pneumothorax. There is no acute osseous abnormality. Bilateral shoulder degenerative changes left greater than right. IMPRESSION: No evidence of acute cardiopulmonary disease. Electronically Signed   By: Caprice Renshaw M.D.   On: 06/23/2021 16:16   ? ? ? ?   ? View : No data to display.  ?  ?  ?  ? ? ?No results found for: NITRICOXIDE ? ? ? ? ? ?Assessment & Plan:  ? ?Exacerbation of asthma ?Acute exacerbation. Unable to complete FeNO. Evidence of bronchospasm on exam. Tx with pred taper and doxy course for  bronchitis. CXR to rule out superimposed infection. Advised to step up back up to [facility] from Symbicort. Check CBC with diff and IgE. If eos elevated, consider adding on singulair. Cough control and mucociliary clearance. ? ?Patien

## 2021-06-23 NOTE — Progress Notes (Signed)
Please notify pt CXR showed clear lungs without any signs of infection. Thanks.

## 2021-06-23 NOTE — Patient Instructions (Addendum)
Continue Breztri 2 puffs Twice daily. Brush tongue and rinse mouth afterwards ?Continue Albuterol inhaler 2 puffs or 3 mL neb every 6 hours as needed for shortness of breath or wheezing. Notify if symptoms persist despite rescue inhaler/neb use. ?Continue flonase nasal spray 2 sprays each nostril daily  ? ?Stop claritin. Start Xyzal 5 mg daily for allergies ?Prednisone taper. 4 tabs for 2 days, then 3 tabs for 2 days, 2 tabs for 2 days, then 1 tab for 2 days, then stop. Take in AM with food ?Doxycycline 1 tab Twice daily for 7 days. Take with food.  ?Mucinex DM 600 mg Twice daily for cough/congestion ?Tessalon perles 1 capsule Three times a day for cough  ? ?Chest x ray. We will notify you of any abnormal results ? ?Labs today ? ?Follow up in 2 weeks with Dr. Francine Graven or Philis Nettle. If symptoms do not improve or worsen, please contact office for sooner follow up or seek emergency care. ?

## 2021-06-23 NOTE — Assessment & Plan Note (Signed)
Persistent symptoms. Trial change to Xyzal from claritin for trigger prevention. Check IgE ?

## 2021-06-23 NOTE — Assessment & Plan Note (Signed)
Acute exacerbation. Unable to complete FeNO. Evidence of bronchospasm on exam. Tx with pred taper and doxy course for bronchitis. CXR to rule out superimposed infection. Advised to step up back up to [facility] from Symbicort. Check CBC with diff and IgE. If eos elevated, consider adding on singulair. Cough control and mucociliary clearance. ? ?Patient Instructions  ?Continue Breztri 2 puffs Twice daily. Brush tongue and rinse mouth afterwards ?Continue Albuterol inhaler 2 puffs or 3 mL neb every 6 hours as needed for shortness of breath or wheezing. Notify if symptoms persist despite rescue inhaler/neb use. ?Continue flonase nasal spray 2 sprays each nostril daily  ? ?Stop claritin. Start Xyzal 5 mg daily for allergies ?Prednisone taper. 4 tabs for 2 days, then 3 tabs for 2 days, 2 tabs for 2 days, then 1 tab for 2 days, then stop. Take in AM with food ?Doxycycline 1 tab Twice daily for 7 days. Take with food.  ?Mucinex DM 600 mg Twice daily for cough/congestion ?Tessalon perles 1 capsule Three times a day for cough  ? ?Chest x ray. We will notify you of any abnormal results ? ?Labs today ? ?Follow up in 2 weeks with Dr. Erin Fulling or Alanson Aly. If symptoms do not improve or worsen, please contact office for sooner follow up or seek emergency care. ? ? ?

## 2021-06-26 LAB — CBC WITH DIFFERENTIAL/PLATELET
Absolute Monocytes: 269 cells/uL (ref 200–950)
Basophils Absolute: 40 cells/uL (ref 0–200)
Basophils Relative: 0.5 %
Eosinophils Absolute: 182 cells/uL (ref 15–500)
Eosinophils Relative: 2.3 %
HCT: 43.9 % (ref 35.0–45.0)
Hemoglobin: 14 g/dL (ref 11.7–15.5)
Lymphs Abs: 2196 cells/uL (ref 850–3900)
MCH: 29.6 pg (ref 27.0–33.0)
MCHC: 31.9 g/dL — ABNORMAL LOW (ref 32.0–36.0)
MCV: 92.8 fL (ref 80.0–100.0)
MPV: 10.4 fL (ref 7.5–12.5)
Monocytes Relative: 3.4 %
Neutro Abs: 5214 cells/uL (ref 1500–7800)
Neutrophils Relative %: 66 %
Platelets: 348 10*3/uL (ref 140–400)
RBC: 4.73 10*6/uL (ref 3.80–5.10)
RDW: 14.2 % (ref 11.0–15.0)
Total Lymphocyte: 27.8 %
WBC: 7.9 10*3/uL (ref 3.8–10.8)

## 2021-06-26 LAB — IGE: IgE (Immunoglobulin E), Serum: 617 kU/L — ABNORMAL HIGH (ref <=114)

## 2021-06-27 NOTE — Progress Notes (Signed)
Please notify patient that her lab results are consistent with allergic asthma. Please have her start singulair 10 mg At bedtime. Monitor for any mood changes and notify if changes occur. If she continues to have breakthrough symptoms, we will refer her to an allergist for further workup. Thanks.

## 2021-07-04 ENCOUNTER — Encounter: Payer: Self-pay | Admitting: Pulmonary Disease

## 2021-07-04 ENCOUNTER — Ambulatory Visit: Payer: Medicare Other | Admitting: Pulmonary Disease

## 2021-07-04 VITALS — BP 124/72 | HR 76

## 2021-07-04 DIAGNOSIS — J4540 Moderate persistent asthma, uncomplicated: Secondary | ICD-10-CM

## 2021-07-04 DIAGNOSIS — J309 Allergic rhinitis, unspecified: Secondary | ICD-10-CM | POA: Diagnosis not present

## 2021-07-04 MED ORDER — BREZTRI AEROSPHERE 160-9-4.8 MCG/ACT IN AERO: 2.0000 | INHALATION_SPRAY | Freq: Two times a day (BID) | RESPIRATORY_TRACT | 3 refills | Status: DC

## 2021-07-04 NOTE — Patient Instructions (Addendum)
Continue to use symbicort 2 puffs twice daily ?- rinse mouth out after each use ? ?Use albuterol inhaler 1-2 puffs every 4-6 hours as needed ? ?Continue montelukast daily ? ?We will give you breztri samples to use until we can get patient assistance paper work completed.  ? ? ? ? ?

## 2021-07-04 NOTE — Progress Notes (Signed)
Subjective:   PATIENT ID: Andrea Townsend GENDER: female DOB: 1960/05/10, MRN: [id number]  HPI  Chief Complaint  Patient presents with   Follow-up    2 wk f/u. States she has been doing well since last visit.    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   She was seen in acute visit on 04/19/21 for sinusitis by Dr. Thora LanceMeier where she was treated with augmentin and 06/23/21 by Rhunette CroftKatie Cobb, NP for return of her cough along with dyspnea and wheezing where she was treated with prednisone and doxycycline. She was started on montelukast for allergies. IgE level was 617 and peripheral eosinophil level was 182 on 06/23/21.   Breztri sample worked really well and along with taking montelukast. She ran out of the breztri and is requesting prescription but will need to fill out patient assistance paper work. She is currently using symbicort inhaler.   OV 09/29/20 She has been on Symbicort 160-4.5 MCG 2 puffs twice daily and as needed albuterol for her asthma since last visit.  She denies any frequent use of her albuterol rescue inhaler.  She reports her breathing has been under good control since last visit.  She continues to take Flonase and Claritin for rhinitis.  She continues to have issues with runny nose but denies significant postnasal drainage.  She denies any significant heartburn or reflux symptoms.  She is accompanied by her husband.  Past Medical History:  Diagnosis Date   Bronchitis    Diabetes mellitus without complication (HCC)    Hyperlipidemia    Hypertension    Stroke (HCC)      Family History  Problem Relation Age of Onset   Asthma Mother        hospitalization with exacerbation   Asthma Sister        hospitalization with exacerbation     Social History   Socioeconomic History   Marital status: Married    Spouse name: Not on  file   Number of children: Not on file   Years of education: Not on file   Highest education level: Not on file  Occupational History   Not on file  Tobacco Use   Smoking status: Former    Packs/day: 1.00    Years: 20.00    Pack years: 20.00    Types: Cigarettes    Quit date: 09/22/2006    Years since quitting: 14.8   Smokeless tobacco: Never  Vaping Use   Vaping Use: Never used  Substance and Sexual Activity   Alcohol use: No   Drug use: No   Sexual activity: Not on file  Other Topics Concern   Not on file  Social History Narrative   Not on file   Social Determinants of Health   Financial Resource Strain: Not on file  Food Insecurity: Not on file  Transportation Needs: Not on file  Physical Activity: Not on file  Stress: Not on file  Social Connections: Not on file  Intimate Partner Violence: Not on file     No Known Allergies   Outpatient Medications Prior to Visit  Medication Sig Dispense Refill   albuterol (PROVENTIL) (2.5 MG/3ML) 0.083% nebulizer solution Take 3 mLs (2.5 mg total) by nebulization every 6 (six) hours. DX: J45.909 360 mL 11   albuterol (VENTOLIN HFA) 108 (90 Base) MCG/ACT inhaler Inhale 2 puffs into the  lungs every 4 (four) hours as needed for wheezing or shortness of breath. 7 g 3   atorvastatin (LIPITOR) 20 MG tablet 1 tablet     benzonatate (TESSALON) 200 MG capsule Take 1 capsule (200 mg total) by mouth 3 (three) times daily as needed. 30 capsule 1   Biotin 1000 MCG tablet Take 1,000 mcg by mouth 3 (three) times daily.     Budeson-Glycopyrrol-Formoterol (BREZTRI AEROSPHERE) 160-9-4.8 MCG/ACT AERO Inhale 2 puffs into the lungs in the morning and at bedtime. 10.7 g 5   levocetirizine (XYZAL) 5 MG tablet Take 1 tablet (5 mg total) by mouth every evening. 30 tablet 5   lisinopril (ZESTRIL) 10 MG tablet Take 1 tablet by mouth daily.     metFORMIN (GLUCOPHAGE) 500 MG tablet 1 tablet with a meal     Multiple Vitamin (MULTIVITAMIN WITH MINERALS) TABS  tablet Take 1 tablet by mouth daily.     omega-3 acid ethyl esters (LOVAZA) 1 G capsule Take 1 capsule by mouth 2 (two) times daily.     predniSONE (DELTASONE) 10 MG tablet 4 tabs for 2 days, then 3 tabs for 2 days, 2 tabs for 2 days, then 1 tab for 2 days, then stop 20 tablet 0   Spacer/Aero-Holding Chambers (AEROCHAMBER MV) inhaler Use as instructed 1 each 0   Albuterol Sulfate, sensor, (PROAIR DIGIHALER) 108 (90 Base) MCG/ACT AEPB Inhale 0.65 g into the lungs daily. 200 each 0   No facility-administered medications prior to visit.    Review of Systems  Constitutional:  Negative for chills, fever, malaise/fatigue and weight loss.  HENT:  Negative for congestion, sinus pain and sore throat.   Eyes: Negative.   Respiratory:  Negative for cough, hemoptysis, sputum production, shortness of breath and wheezing.   Cardiovascular:  Negative for chest pain, palpitations, orthopnea, claudication and leg swelling.  Gastrointestinal:  Negative for abdominal pain, heartburn, nausea and vomiting.  Genitourinary: Negative.   Musculoskeletal:  Negative for joint pain and myalgias.  Skin:  Negative for rash.  Neurological:  Negative for weakness.  Endo/Heme/Allergies: Negative.   Psychiatric/Behavioral: Negative.     Objective:   Vitals:   07/04/21 1552  BP: 124/72  Pulse: 76  SpO2: 98%    Physical Exam Constitutional:      General: She is not in acute distress.    Appearance: She is not ill-appearing.  HENT:     Head: Normocephalic and atraumatic.  Eyes:     General: No scleral icterus.    Conjunctiva/sclera: Conjunctivae normal.  Cardiovascular:     Rate and Rhythm: Normal rate and regular rhythm.     Pulses: Normal pulses.     Heart sounds: Normal heart sounds. No murmur heard. Pulmonary:     Effort: Pulmonary effort is normal.     Breath sounds: Normal breath sounds. No wheezing, rhonchi or rales.  Musculoskeletal:     Right lower leg: No edema.     Left lower leg: No edema.   Skin:    General: Skin is warm and dry.  Neurological:     General: No focal deficit present.     Mental Status: She is alert.  Psychiatric:        Mood and Affect: Mood normal.        Behavior: Behavior normal.        Thought Content: Thought content normal.        Judgment: Judgment normal.    CBC    Component Value Date/Time  WBC 7.9 06/23/2021 1610   RBC 4.73 06/23/2021 1610   HGB 14.0 06/23/2021 1610   HCT 43.9 06/23/2021 1610   PLT 348 06/23/2021 1610   MCV 92.8 06/23/2021 1610   MCH 29.6 06/23/2021 1610   MCHC 31.9 (L) 06/23/2021 1610   RDW 14.2 06/23/2021 1610   LYMPHSABS 2,196 06/23/2021 1610   MONOABS 0.3 04/13/2014 1654   EOSABS 182 06/23/2021 1610   BASOSABS 40 06/23/2021 1610      Latest Ref Rng & Units 09/21/2013    2:57 AM 08/17/2013    2:08 PM 11/25/2009    3:52 AM  BMP  Glucose 70 - 99 mg/dL 361   443   154    BUN 6 - 23 mg/dL 11   12   12     Creatinine 0.50 - 1.10 mg/dL   0.08   6.76    Sodium 137 - 147 mEq/L 141   140   136    Potassium 3.7 - 5.3 mEq/L 4.0   3.9   4.0    Chloride 96 - 112 mEq/L 107   104   109    CO2 19 - 32 mEq/L  21   21    Calcium 8.4 - 10.5 mg/dL  9.3   9.2     Chest imaging: CXR 09/20/2013 The heart size and mediastinal contours are within normal limits.  Both lungs are clear. The visualized skeletal structures are  unremarkable.  PFT:     View : No data to display.            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   Her asthma appears to be well controlled at this time.  She is to continue Symbicort 160-4.5 MCG 2 puffs twice daily with as needed albuterol until she can get Breztriz inhaler via patient assistance  program. Paper work was provided for the patient and husband to complete.   She is to continue montelukast daily.  She is to continue on fluticasone and Claritin for her rhinitis.  Follow-up in 6 months.  67, MD [facility] Office: [phone]   Current Outpatient Medications:    albuterol (PROVENTIL) (2.5 MG/3ML) 0.083% nebulizer solution, Take 3 mLs (2.5 mg total) by nebulization every 6 (six) hours. DX: J45.909, Disp: 360 mL, Rfl: 11   albuterol (VENTOLIN HFA) 108 (90 Base) MCG/ACT inhaler, Inhale 2 puffs into the lungs every 4 (four) hours as needed for wheezing or shortness of breath., Disp: 7 g, Rfl: 3   atorvastatin (LIPITOR) 20 MG tablet, 1 tablet, Disp: , Rfl:    benzonatate (TESSALON) 200 MG capsule, Take 1 capsule (200 mg total) by mouth 3 (three) times daily as needed., Disp: 30 capsule, Rfl: 1   Biotin 1000 MCG tablet, Take 1,000 mcg by mouth 3 (three) times daily., Disp: , Rfl:    Budeson-Glycopyrrol-Formoterol (BREZTRI AEROSPHERE) 160-9-4.8 MCG/ACT AERO, Inhale 2 puffs into the lungs in the morning and at bedtime., Disp: 10.7 g, Rfl: 5   Budeson-Glycopyrrol-Formoterol (BREZTRI AEROSPHERE) 160-9-4.8 MCG/ACT AERO, Inhale 2 puffs into the lungs in the morning and at bedtime., Disp: 32.1 g, Rfl: 3   levocetirizine (XYZAL) 5 MG tablet, Take 1 tablet (5 mg total) by mouth every  evening., Disp: 30 tablet, Rfl: 5   lisinopril (ZESTRIL) 10 MG tablet, Take 1 tablet by mouth daily., Disp: , Rfl:    metFORMIN (GLUCOPHAGE) 500 MG tablet, 1 tablet with a meal, Disp: , Rfl:    Multiple Vitamin (MULTIVITAMIN WITH MINERALS) TABS tablet, Take 1 tablet by mouth daily., Disp: , Rfl:    omega-3 acid ethyl esters (LOVAZA) 1 G capsule, Take 1 capsule by mouth 2 (two) times daily., Disp: , Rfl:    predniSONE (DELTASONE) 10 MG tablet, 4 tabs for 2 days, then 3 tabs for 2 days, 2 tabs for 2 days, then 1 tab for 2 days, then stop, Disp: 20 tablet, Rfl: 0    Spacer/Aero-Holding Chambers (AEROCHAMBER MV) inhaler, Use as instructed, Disp: 1 each, Rfl: 0

## 2021-07-09 ENCOUNTER — Encounter: Payer: Self-pay | Admitting: Pulmonary Disease

## 2021-10-13 DIAGNOSIS — Z0000 Encounter for general adult medical examination without abnormal findings: Secondary | ICD-10-CM | POA: Diagnosis not present

## 2021-10-13 DIAGNOSIS — R7309 Other abnormal glucose: Secondary | ICD-10-CM | POA: Diagnosis not present

## 2021-10-13 DIAGNOSIS — Z8673 Personal history of transient ischemic attack (TIA), and cerebral infarction without residual deficits: Secondary | ICD-10-CM | POA: Diagnosis not present

## 2021-10-13 DIAGNOSIS — I69351 Hemiplegia and hemiparesis following cerebral infarction affecting right dominant side: Secondary | ICD-10-CM | POA: Diagnosis not present

## 2021-10-13 DIAGNOSIS — Z1331 Encounter for screening for depression: Secondary | ICD-10-CM | POA: Diagnosis not present

## 2021-10-13 DIAGNOSIS — I10 Essential (primary) hypertension: Secondary | ICD-10-CM | POA: Diagnosis not present

## 2021-10-13 DIAGNOSIS — J301 Allergic rhinitis due to pollen: Secondary | ICD-10-CM | POA: Diagnosis not present

## 2021-10-13 DIAGNOSIS — Z23 Encounter for immunization: Secondary | ICD-10-CM | POA: Diagnosis not present

## 2021-10-18 DIAGNOSIS — L820 Inflamed seborrheic keratosis: Secondary | ICD-10-CM | POA: Diagnosis not present

## 2021-11-14 DIAGNOSIS — N3281 Overactive bladder: Secondary | ICD-10-CM | POA: Diagnosis not present

## 2021-11-14 DIAGNOSIS — I10 Essential (primary) hypertension: Secondary | ICD-10-CM | POA: Diagnosis not present

## 2021-11-14 DIAGNOSIS — Z23 Encounter for immunization: Secondary | ICD-10-CM | POA: Diagnosis not present

## 2021-12-12 DIAGNOSIS — I69351 Hemiplegia and hemiparesis following cerebral infarction affecting right dominant side: Secondary | ICD-10-CM | POA: Diagnosis not present

## 2021-12-12 DIAGNOSIS — I10 Essential (primary) hypertension: Secondary | ICD-10-CM | POA: Diagnosis not present

## 2021-12-12 DIAGNOSIS — Z993 Dependence on wheelchair: Secondary | ICD-10-CM | POA: Diagnosis not present

## 2021-12-14 ENCOUNTER — Other Ambulatory Visit: Payer: Self-pay | Admitting: Internal Medicine

## 2021-12-14 DIAGNOSIS — Z1231 Encounter for screening mammogram for malignant neoplasm of breast: Secondary | ICD-10-CM

## 2022-03-02 DIAGNOSIS — I10 Essential (primary) hypertension: Secondary | ICD-10-CM | POA: Diagnosis not present

## 2022-03-02 DIAGNOSIS — Z8673 Personal history of transient ischemic attack (TIA), and cerebral infarction without residual deficits: Secondary | ICD-10-CM | POA: Diagnosis not present

## 2022-03-02 DIAGNOSIS — I69351 Hemiplegia and hemiparesis following cerebral infarction affecting right dominant side: Secondary | ICD-10-CM | POA: Diagnosis not present

## 2022-03-02 DIAGNOSIS — J019 Acute sinusitis, unspecified: Secondary | ICD-10-CM | POA: Diagnosis not present

## 2022-03-16 ENCOUNTER — Telehealth: Payer: Self-pay | Admitting: Pulmonary Disease

## 2022-03-16 NOTE — Telephone Encounter (Signed)
Called and spoke to husband and updated him that at this time we do not have any samples. I advised him to call back next week to see if we have samples. Nothing further needed

## 2022-03-19 ENCOUNTER — Telehealth: Payer: Self-pay | Admitting: Pulmonary Disease

## 2022-03-19 NOTE — Telephone Encounter (Signed)
Pt needs to be seen for an appt prior to [place] giving her samples. Last seen May 2023 and was told to follow up 6 months from that appt and she has not been seen since.   Attempted to call pt's spouse Audry Pili but unable to reach. Left message for him to return call.

## 2022-03-19 NOTE — Telephone Encounter (Signed)
Wants Breztri samples. Husband calling for wife. Pls call @ [phone]

## 2022-03-23 ENCOUNTER — Ambulatory Visit: Payer: Medicare Other

## 2022-03-27 ENCOUNTER — Ambulatory Visit (INDEPENDENT_AMBULATORY_CARE_PROVIDER_SITE_OTHER): Payer: Medicare Other | Admitting: Pulmonary Disease

## 2022-03-27 ENCOUNTER — Encounter: Payer: Self-pay | Admitting: Pulmonary Disease

## 2022-03-27 VITALS — HR 87 | Ht 64.0 in | Wt 219.0 lb

## 2022-03-27 DIAGNOSIS — J4540 Moderate persistent asthma, uncomplicated: Secondary | ICD-10-CM

## 2022-03-27 MED ORDER — BREZTRI AEROSPHERE 160-9-4.8 MCG/ACT IN AERO: 2.0000 | INHALATION_SPRAY | Freq: Two times a day (BID) | RESPIRATORY_TRACT | 28 refills | Status: DC

## 2022-03-27 NOTE — Addendum Note (Signed)
Addended by: Priscille Kluver on: 03/27/2022 04:36 PM   Modules accepted: Orders

## 2022-03-27 NOTE — Patient Instructions (Addendum)
Continue breztri 2 puffs twice daily - rinse mouth out after each use  Continue to use albuterol as needed  We will fill out patient assistance paperwork today for the breztri to get you covered for a year  Follow up in 1 year

## 2022-03-27 NOTE — Progress Notes (Signed)
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   She has been doing ok since last visit. She has been using breztri inhaler as needed as the patient assistance forms were not completed after last visit. She is using breztri every other day on average. No other acute complaints at this time.   OV 07/04/21 She was seen in acute visit on 04/19/21 for sinusitis by Dr. Verlee Monte where she was treated with augmentin and 06/23/21 by Roxan Diesel, NP for return of her cough along with dyspnea and wheezing where she was treated with prednisone and doxycycline. She was started on montelukast for allergies. IgE level was 617 and peripheral eosinophil level was 182 on 06/23/21.   Breztri sample worked really well and along with taking montelukast. She ran out of the breztri and is requesting prescription but will need to fill out patient assistance paper work. She is currently using symbicort inhaler.   OV 09/29/20 She has been on Symbicort 160-4.5 MCG 2 puffs twice daily and as needed albuterol for her asthma since last visit.  She denies any frequent use of her albuterol rescue inhaler.  She reports her breathing has been under good control since last visit.  She continues to take Flonase and Claritin for rhinitis.  She continues to have issues with runny nose but denies significant postnasal drainage.  She denies any significant heartburn or reflux symptoms.  She is accompanied by her husband.  Past Medical History:  Diagnosis Date   Bronchitis    Diabetes mellitus without complication ([place])    Hyperlipidemia     Hypertension    Stroke ([place])      Family History  Problem Relation Age of Onset   Asthma Mother        hospitalization with exacerbation   Asthma Sister        hospitalization with exacerbation     Social History   Socioeconomic History   Marital status: Married    Spouse name: Not on file   Number of children: Not on file   Years of education: Not on file   Highest education level: Not on file  Occupational History   Not on file  Tobacco Use   Smoking status: Former    Packs/day: 1.00    Years: 20.00    Total pack years: 20.00    Types: Cigarettes    Quit date: 09/22/2006    Years since quitting: 15.5   Smokeless tobacco: Never  Vaping Use   Vaping Use: Never used  Substance and Sexual Activity   Alcohol use: No   Drug use: No   Sexual activity: Not on file  Other Topics Concern   Not on file  Social History Narrative   Not on file   Social Determinants of Health   Financial Resource Strain: Not on file  Food Insecurity: Not on file  Transportation Needs: Not on file  Physical Activity: Not on file  Stress: Not on file  Social Connections: Not on file  Intimate Partner Violence: Not on file  No Known Allergies   Outpatient Medications Prior to Visit  Medication Sig Dispense Refill   albuterol (PROVENTIL) (2.5 MG/3ML) 0.083% nebulizer solution Take 3 mLs (2.5 mg total) by nebulization every 6 (six) hours. DX: J45.909 360 mL 11   albuterol (VENTOLIN HFA) 108 (90 Base) MCG/ACT inhaler Inhale 2 puffs into the lungs every 4 (four) hours as needed for wheezing or shortness of breath. 7 g 3   atorvastatin (LIPITOR) 20 MG tablet 1 tablet     Biotin 1000 MCG tablet Take 1,000 mcg by mouth 3 (three) times daily.     Budeson-Glycopyrrol-Formoterol (BREZTRI AEROSPHERE) 160-9-4.8 MCG/ACT AERO Inhale 2 puffs into the lungs in the morning and at bedtime. 10.7 g 5   Budeson-Glycopyrrol-Formoterol (BREZTRI AEROSPHERE) 160-9-4.8 MCG/ACT AERO Inhale 2 puffs into the  lungs in the morning and at bedtime. 32.1 g 3   lisinopril (ZESTRIL) 10 MG tablet Take 1 tablet by mouth daily.     loratadine (CLARITIN) 10 MG tablet Take 10 mg by mouth daily.     metFORMIN (GLUCOPHAGE) 500 MG tablet 1 tablet with a meal     Multiple Vitamin (MULTIVITAMIN WITH MINERALS) TABS tablet Take 1 tablet by mouth daily.     omega-3 acid ethyl esters (LOVAZA) 1 G capsule Take 1 capsule by mouth 2 (two) times daily.     Spacer/Aero-Holding Chambers (AEROCHAMBER MV) inhaler Use as instructed 1 each 0   levocetirizine (XYZAL) 5 MG tablet Take 1 tablet (5 mg total) by mouth every evening. 30 tablet 5   benzonatate (TESSALON) 200 MG capsule Take 1 capsule (200 mg total) by mouth 3 (three) times daily as needed. (Patient not taking: Reported on 03/27/2022) 30 capsule 1   predniSONE (DELTASONE) 10 MG tablet 4 tabs for 2 days, then 3 tabs for 2 days, 2 tabs for 2 days, then 1 tab for 2 days, then stop 20 tablet 0   No facility-administered medications prior to visit.    Review of Systems  Constitutional:  Negative for chills, fever, malaise/fatigue and weight loss.  HENT:  Negative for congestion, sinus pain and sore throat.   Eyes: Negative.   Respiratory:  Positive for shortness of breath and wheezing. Negative for cough, hemoptysis and sputum production.   Cardiovascular:  Negative for chest pain, palpitations, orthopnea, claudication and leg swelling.  Gastrointestinal:  Negative for abdominal pain, heartburn, nausea and vomiting.  Genitourinary: Negative.   Musculoskeletal:  Negative for joint pain and myalgias.  Skin:  Negative for rash.  Neurological:  Negative for weakness.  Endo/Heme/Allergies: Negative.   Psychiatric/Behavioral: Negative.      Objective:   Vitals:   03/27/22 1534  Pulse: 87  SpO2: 97%  Weight: 219 lb (99.3 kg)  Height: 5\' 4"  (1.626 m)    Physical Exam Constitutional:      General: She is not in acute distress.    Appearance: She is not  ill-appearing.  HENT:     Head: Normocephalic and atraumatic.  Eyes:     General: No scleral icterus.    Conjunctiva/sclera: Conjunctivae normal.  Cardiovascular:     Rate and Rhythm: Normal rate and regular rhythm.     Pulses: Normal pulses.     Heart sounds: Normal heart sounds. No murmur heard. Pulmonary:     Effort: Pulmonary effort is normal.     Breath sounds: Normal breath sounds. No wheezing, rhonchi or rales.  Musculoskeletal:     Right lower leg: No edema.     Left lower leg:  No edema.  Skin:    General: Skin is warm and dry.  Neurological:     General: No focal deficit present.     Mental Status: She is alert.  Psychiatric:        Mood and Affect: Mood normal.        Behavior: Behavior normal.        Thought Content: Thought content normal.        Judgment: Judgment normal.     CBC    Component Value Date/Time   WBC 7.9 06/23/2021 1610   RBC 4.73 06/23/2021 1610   HGB 14.0 06/23/2021 1610   HCT 43.9 06/23/2021 1610   PLT 348 06/23/2021 1610   MCV 92.8 06/23/2021 1610   MCH 29.6 06/23/2021 1610   MCHC 31.9 (L) 06/23/2021 1610   RDW 14.2 06/23/2021 1610   LYMPHSABS 2,196 06/23/2021 1610   MONOABS 0.3 04/13/2014 1654   EOSABS 182 06/23/2021 1610   BASOSABS 40 06/23/2021 1610      Latest Ref Rng & Units 09/21/2013    2:57 AM 08/17/2013    2:08 PM 11/25/2009    3:52 AM  BMP  Glucose 70 - 99 mg/dL 409  811  914   BUN 6 - 23 mg/dL 11  12  12    Creatinine 0.50 - 1.10 mg/dL  7.82  9.56   Sodium 137 - 147 mEq/L 141  140  136   Potassium 3.7 - 5.3 mEq/L 4.0  3.9  4.0   Chloride 96 - 112 mEq/L 107  104  109   CO2 19 - 32 mEq/L  21  21   Calcium 8.4 - 10.5 mg/dL  9.3  9.2    Chest imaging: CXR 09/20/2013 The heart size and mediastinal contours are within normal limits.  Both lungs are clear. The visualized skeletal structures are  unremarkable.  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   We will complete breztri patient assistance paper work today so she can get a 1 year supply of medicine. We will provide her with samples as well today.   She can continue to use albuterol inhaler 2 puffs twice daily.   She is to continue montelukast daily.  She is to continue on fluticasone and Claritin for her rhinitis.  Follow-up in 1 year.  77, MD [facility] Office: [phone]   Current Outpatient Medications:    albuterol (PROVENTIL) (2.5 MG/3ML) 0.083% nebulizer solution, Take 3 mLs (2.5 mg total) by nebulization every 6 (six) hours. DX: J45.909, Disp: 360 mL, Rfl: 11   albuterol (VENTOLIN HFA) 108 (90 Base) MCG/ACT inhaler, Inhale 2 puffs into the lungs every 4 (four) hours as needed for wheezing or shortness of breath., Disp: 7 g, Rfl: 3   atorvastatin (LIPITOR) 20 MG tablet, 1 tablet, Disp: , Rfl:    Biotin 1000 MCG tablet, Take 1,000 mcg by mouth 3 (three) times daily., Disp: , Rfl:    Budeson-Glycopyrrol-Formoterol (BREZTRI AEROSPHERE) 160-9-4.8 MCG/ACT AERO, Inhale 2 puffs into the lungs in the morning and at bedtime., Disp: 10.7 g, Rfl: 5   Budeson-Glycopyrrol-Formoterol (BREZTRI AEROSPHERE) 160-9-4.8 MCG/ACT AERO, Inhale 2 puffs into  the lungs in the morning and at bedtime., Disp: 32.1 g, Rfl: 3   lisinopril (ZESTRIL) 10 MG tablet, Take 1 tablet by mouth daily., Disp: , Rfl:    loratadine (CLARITIN) 10 MG tablet, Take 10 mg by mouth daily., Disp: , Rfl:    metFORMIN (GLUCOPHAGE) 500 MG tablet, 1 tablet with a meal, Disp: , Rfl:    Multiple Vitamin (MULTIVITAMIN WITH MINERALS) TABS tablet, Take 1 tablet by mouth daily., Disp: , Rfl:    omega-3 acid ethyl esters (LOVAZA) 1 G capsule, Take 1 capsule by mouth 2 (two) times  daily., Disp: , Rfl:    Spacer/Aero-Holding Chambers (AEROCHAMBER MV) inhaler, Use as instructed, Disp: 1 each, Rfl: 0   benzonatate (TESSALON) 200 MG capsule, Take 1 capsule (200 mg total) by mouth 3 (three) times daily as needed. (Patient not taking: Reported on 03/27/2022), Disp: 30 capsule, Rfl: 1

## 2022-04-25 ENCOUNTER — Ambulatory Visit (HOSPITAL_COMMUNITY): Payer: Medicare Other

## 2022-04-25 ENCOUNTER — Ambulatory Visit: Payer: Medicare Other

## 2022-04-26 DIAGNOSIS — K080 Exfoliation of teeth due to systemic causes: Secondary | ICD-10-CM | POA: Diagnosis not present

## 2022-05-01 ENCOUNTER — Ambulatory Visit
Admission: RE | Admit: 2022-05-01 | Discharge: 2022-05-01 | Disposition: A | Discharge status: Home / Self Care | Payer: Medicare Other | Source: Ambulatory Visit | Attending: Internal Medicine | Admitting: Internal Medicine

## 2022-05-01 DIAGNOSIS — Z1231 Encounter for screening mammogram for malignant neoplasm of breast: Secondary | ICD-10-CM

## 2022-05-02 NOTE — Telephone Encounter (Signed)
1 

## 2022-05-09 ENCOUNTER — Ambulatory Visit: Payer: Medicare Other | Admitting: Physician Assistant

## 2022-05-09 ENCOUNTER — Ambulatory Visit (HOSPITAL_COMMUNITY)
Admission: RE | Admit: 2022-05-09 | Discharge: 2022-05-09 | Disposition: A | Discharge status: Home / Self Care | Payer: Medicare Other | Source: Ambulatory Visit | Attending: Physician Assistant | Admitting: Physician Assistant

## 2022-05-09 VITALS — BP 131/84 | HR 94 | Temp 97.6°F

## 2022-05-09 DIAGNOSIS — I739 Peripheral vascular disease, unspecified: Secondary | ICD-10-CM

## 2022-05-09 LAB — VAS US ABI WITH/WO TBI
Left ABI: 1.16
Right ABI: 0.66

## 2022-05-09 NOTE — Progress Notes (Signed)
Office Note     CC:  follow up Requesting Provider:  Leeroy Cha,*  HPI: Andrea Townsend is a 62 y.o. (03/15/1960) female who presents for surveillance of PAD.  She has a known right external iliac artery occlusion.  She previously had a right calf wound that has since healed.  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  She also has expressive aphasia.  She is here with her husband who is her primary caregiver.  He states she is able to pivot with her right leg however does little walking.  She is without any further tissue loss of the right lower extremity.  She denies any pain in her feet.  She is on a daily statin.   Past Medical History:  Diagnosis Date   Bronchitis    Diabetes mellitus without complication ([place])    Hyperlipidemia    Hypertension    Stroke [facility])     Past Surgical History:  Procedure Laterality Date   ABDOMINAL SURGERY     CESAREAN SECTION      Social History   Socioeconomic History   Marital status: Married    Spouse name: Not on file   Number of children: Not on file   Years of education: Not on file   Highest education level: Not on file  Occupational History   Not on file  Tobacco Use   Smoking status: Former    Packs/day: 1.00    Years: 20.00    Additional pack years: 0.00    Total pack years: 20.00    Types: Cigarettes    Quit date: 09/22/2006    Years since quitting: 15.6   Smokeless tobacco: Never  Vaping Use   Vaping Use: Never used  Substance and Sexual Activity   Alcohol use: No   Drug use: No   Sexual activity: Not on file  Other Topics Concern   Not on file  Social History Narrative   Not on file   Social Determinants of Health   Financial Resource Strain: Not on file  Food Insecurity: Not on file  Transportation Needs: Not on file  Physical Activity: Not on file  Stress: Not on file  Social Connections: Not on file  Intimate Partner Violence: Not on file    Family  History  Problem Relation Age of Onset   Asthma Mother        hospitalization with exacerbation   Asthma Sister        hospitalization with exacerbation    Current Outpatient Medications  Medication Sig Dispense Refill   albuterol (PROVENTIL) (2.5 MG/3ML) 0.083% nebulizer solution Take 3 mLs (2.5 mg total) by nebulization every 6 (six) hours. DX: J45.909 360 mL 11   albuterol (VENTOLIN HFA) 108 (90 Base) MCG/ACT inhaler Inhale 2 puffs into the lungs every 4 (four) hours as needed for wheezing or shortness of breath. 7 g 3   atorvastatin (LIPITOR) 20 MG tablet 1 tablet     Biotin 1000 MCG tablet Take 1,000 mcg by mouth 3 (three) times daily.     Budeson-Glycopyrrol-Formoterol (BREZTRI AEROSPHERE) 160-9-4.8 MCG/ACT AERO Inhale 2 puffs into the lungs in the morning and at bedtime. 10.7 g 5   Budeson-Glycopyrrol-Formoterol (BREZTRI AEROSPHERE) 160-9-4.8 MCG/ACT AERO Inhale 2 puffs into the lungs in the morning and at bedtime. 10.7 g 28   lisinopril (ZESTRIL) 10 MG tablet Take 1 tablet by mouth daily.     loratadine (CLARITIN) 10 MG tablet Take  10 mg by mouth daily.     metFORMIN (GLUCOPHAGE) 500 MG tablet 1 tablet with a meal     Multiple Vitamin (MULTIVITAMIN WITH MINERALS) TABS tablet Take 1 tablet by mouth daily.     omega-3 acid ethyl esters (LOVAZA) 1 G capsule Take 1 capsule by mouth 2 (two) times daily.     Spacer/Aero-Holding Chambers (AEROCHAMBER MV) inhaler Use as instructed 1 each 0   No current facility-administered medications for this visit.    No Known Allergies   REVIEW OF SYSTEMS:   [X]  denotes positive finding, [ ]  denotes negative finding Cardiac  Comments:  Chest pain or chest pressure:    Shortness of breath upon exertion:    Short of breath when lying flat:    Irregular heart rhythm:        Vascular    Pain in calf, thigh, or hip brought on by ambulation:    Pain in feet at night that wakes you up from your sleep:     Blood clot in your veins:    Leg  swelling:         Pulmonary    Oxygen at home:    Productive cough:     Wheezing:         Neurologic    Sudden weakness in arms or legs:     Sudden numbness in arms or legs:     Sudden onset of difficulty speaking or slurred speech:    Temporary loss of vision in one eye:     Problems with dizziness:         Gastrointestinal    Blood in stool:     Vomited blood:         Genitourinary    Burning when urinating:     Blood in urine:        Psychiatric    Major depression:         Hematologic    Bleeding problems:    Problems with blood clotting too easily:        Skin    Rashes or ulcers:        Constitutional    Fever or chills:      PHYSICAL EXAMINATION:  Vitals:   05/09/22 1352  BP: 131/84  Pulse: 94  Temp: 97.6 F (36.4 C)  TempSrc: Temporal  SpO2: 97%    General:  WDWN in NAD; vital signs documented above Gait: Not observed HENT: WNL, normocephalic Pulmonary: normal non-labored breathing , without Rales, rhonchi,  wheezing Cardiac: regular HR Abdomen: soft, NT, no masses Skin: without rashes Vascular Exam/Pulses: Right foot discolored with venous congestion, blanchable; no wounds bilateral lower extremities Musculoskeletal: no muscle wasting or atrophy  Neurologic: A&O X 3; paralysis of the right arm and leg; expressive aphasia Psychiatric:  The pt has Normal affect.   Non-Invasive Vascular Imaging:   ABI/TBIToday's ABIToday's TBIPrevious ABIPrevious TBI  +-------+-----------+-----------+------------+------------+  Right 0.66       0.55       0.55        0.47          +-------+-----------+-----------+------------+------------+  Left  1.16       0.87       1.12        0.71             ASSESSMENT/PLAN:: 62 y.o. female with PAD  -Patient has a known right external iliac artery occlusion.  She previously had a right calf wound that has since  healed.  She does not have any further tissue loss.  She is also without rest pain.  ABIs  are unchanged since last office visit 1 year ago.  She will continue her statin daily.  She is able to pivot and bear weight on her right leg despite being minimally ambulatory.  We would not pursue revascularization of right lower extremity unless there were to be tissue loss or rest pain.  We will repeat her ABIs in 1 year.   Dagoberto Ligas, PA-C [facility] [phone]  Clinic MD:   Donzetta Matters

## 2022-06-04 ENCOUNTER — Telehealth: Payer: Self-pay | Admitting: Pulmonary Disease

## 2022-06-04 NOTE — Telephone Encounter (Signed)
Pt's spouse Clide Cliff called the office requesting to have more samples of Breztri for pt. He stated that he was told that they were told something was going to be sent in to see if pt could possibly receive Breztri at no cost but said that they have not heard anything about this if anything was ever sent in.  While they are waiting to hear about this, they are wanting to know if more samples could be given.  Dr. Francine Graven, please advise if you are okay with this. Also routing to Cherina to see if she knows anything about paperwork for patient assistance being sent on pt.

## 2022-06-06 MED ORDER — BREZTRI AEROSPHERE 160-9-4.8 MCG/ACT IN AERO: 2.0000 | INHALATION_SPRAY | Freq: Two times a day (BID) | RESPIRATORY_TRACT | 0 refills | Status: DC

## 2022-06-06 NOTE — Telephone Encounter (Signed)
Called and spoke with patient's husband. Advised him that we received a confirmation from [employer] back in February for patient assistance for [facility]. I have faxed the application twice (4/16 and 4/17) and received confirmations for these as well. I have placed 2 samples of Breztri at the front desk for him to pick up, he verbalized understanding.   Nothing further needed at time of call.

## 2022-06-09 DIAGNOSIS — R0602 Shortness of breath: Secondary | ICD-10-CM | POA: Diagnosis not present

## 2022-06-09 DIAGNOSIS — J209 Acute bronchitis, unspecified: Secondary | ICD-10-CM | POA: Diagnosis not present

## 2022-06-09 DIAGNOSIS — R059 Cough, unspecified: Secondary | ICD-10-CM | POA: Diagnosis not present

## 2022-06-28 DIAGNOSIS — B9620 Unspecified Escherichia coli [E. coli] as the cause of diseases classified elsewhere: Secondary | ICD-10-CM | POA: Diagnosis not present

## 2022-06-28 DIAGNOSIS — N390 Urinary tract infection, site not specified: Secondary | ICD-10-CM | POA: Diagnosis not present

## 2022-06-28 DIAGNOSIS — N3941 Urge incontinence: Secondary | ICD-10-CM | POA: Diagnosis not present

## 2022-06-28 DIAGNOSIS — N3281 Overactive bladder: Secondary | ICD-10-CM | POA: Diagnosis not present

## 2022-06-29 DIAGNOSIS — R32 Unspecified urinary incontinence: Secondary | ICD-10-CM | POA: Diagnosis not present

## 2022-06-29 DIAGNOSIS — N3000 Acute cystitis without hematuria: Secondary | ICD-10-CM | POA: Diagnosis not present

## 2022-07-11 DIAGNOSIS — N3281 Overactive bladder: Secondary | ICD-10-CM | POA: Diagnosis not present

## 2022-07-11 DIAGNOSIS — N3946 Mixed incontinence: Secondary | ICD-10-CM | POA: Diagnosis not present

## 2022-08-28 DIAGNOSIS — N958 Other specified menopausal and perimenopausal disorders: Secondary | ICD-10-CM | POA: Diagnosis not present

## 2022-08-28 DIAGNOSIS — N3946 Mixed incontinence: Secondary | ICD-10-CM | POA: Diagnosis not present

## 2022-09-20 ENCOUNTER — Telehealth: Payer: Self-pay

## 2022-09-20 ENCOUNTER — Other Ambulatory Visit: Payer: Self-pay

## 2022-09-20 DIAGNOSIS — I739 Peripheral vascular disease, unspecified: Secondary | ICD-10-CM

## 2022-09-20 NOTE — Telephone Encounter (Signed)
Pt's spouse called with c/o pt having RLE pain and swelling for "few weeks". Pt has been given an appt for tomorrow morning with AbIs and to see APP.

## 2022-09-21 ENCOUNTER — Ambulatory Visit: Payer: Medicare Other | Admitting: Physician Assistant

## 2022-09-21 ENCOUNTER — Ambulatory Visit (HOSPITAL_COMMUNITY)
Admission: RE | Admit: 2022-09-21 | Discharge: 2022-09-21 | Disposition: A | Discharge status: Home / Self Care | Payer: Medicare Other | Source: Ambulatory Visit | Attending: Vascular Surgery | Admitting: Vascular Surgery

## 2022-09-21 ENCOUNTER — Other Ambulatory Visit (HOSPITAL_COMMUNITY): Payer: Self-pay | Admitting: Physician Assistant

## 2022-09-21 ENCOUNTER — Other Ambulatory Visit: Payer: Self-pay | Admitting: *Deleted

## 2022-09-21 ENCOUNTER — Ambulatory Visit (INDEPENDENT_AMBULATORY_CARE_PROVIDER_SITE_OTHER)
Admission: RE | Admit: 2022-09-21 | Discharge: 2022-09-21 | Disposition: A | Discharge status: Home / Self Care | Payer: Medicare Other | Source: Ambulatory Visit | Attending: Physician Assistant | Admitting: Physician Assistant

## 2022-09-21 VITALS — BP 149/84 | HR 88 | Temp 97.9°F | Resp 18 | Ht 64.0 in | Wt 219.0 lb

## 2022-09-21 DIAGNOSIS — R609 Edema, unspecified: Secondary | ICD-10-CM | POA: Insufficient documentation

## 2022-09-21 DIAGNOSIS — I739 Peripheral vascular disease, unspecified: Secondary | ICD-10-CM | POA: Diagnosis not present

## 2022-09-21 DIAGNOSIS — I82441 Acute embolism and thrombosis of right tibial vein: Secondary | ICD-10-CM

## 2022-09-21 DIAGNOSIS — I745 Embolism and thrombosis of iliac artery: Secondary | ICD-10-CM | POA: Diagnosis not present

## 2022-09-21 LAB — VAS US ABI WITH/WO TBI
Left ABI: 1.07
Right ABI: 0.65

## 2022-09-21 MED ORDER — RIVAROXABAN (XARELTO) VTE STARTER PACK (15 & 20 MG): ORAL_TABLET | ORAL | 0 refills | Status: DC

## 2022-09-21 NOTE — Progress Notes (Signed)
U/S order placed

## 2022-09-21 NOTE — Progress Notes (Signed)
Office Note     CC:  follow up Requesting Provider:  Lorenda Ishihara,*  HPI: Andrea Townsend is a 62 y.o. (1960-09-24) female who presents with Andrea Townsend with concerns of right lower extremity pain and swelling for 2 weeks. Andrea Townsend assists with providing Andrea history as she has expressive aphasia from Andrea prior CVA. She explains that Andrea pain is constant from knee down into foot. Hurting during day and night. This came on suddenly. Does not report any changes in activity or a fall. We have been following Andrea for PAD with known right external iliac artery occlusion. She has history of CVA with paralysis of right upper and lower extremity. As a result she does very minimal ambulating. She is in wheel chair most of Andrea time. She can pivot and use right leg to help shift. She has had prior wound on Andrea right leg but this remains healed. She has no current wounds. She is medically managed on Statin.  Past Medical History:  Diagnosis Date   Bronchitis    Diabetes mellitus without complication ([facility])    Hyperlipidemia    Hypertension    Stroke [facility])     Past Surgical History:  Procedure Laterality Date   ABDOMINAL SURGERY     CESAREAN SECTION      Social History   Socioeconomic History   Marital status: Married    Spouse name: Not on file   Number of children: Not on file   Years of education: Not on file   Highest education level: Not on file  Occupational History   Not on file  Tobacco Use   Smoking status: Former    Current packs/day: 0.00    Average packs/day: 1 pack/day for 20.0 years (20.0 ttl pk-yrs)    Types: Cigarettes    Start date: 09/22/1986    Quit date: 09/22/2006    Years since quitting: 16.0   Smokeless tobacco: Never  Vaping Use   Vaping status: Never Used  Substance and Sexual Activity   Alcohol use: No   Drug use: No   Sexual activity: Not on file  Other Topics Concern   Not on file  Social History Narrative   Not on file   Social  Determinants of Health   Financial Resource Strain: Not on file  Food Insecurity: Not on file  Transportation Needs: Not on file  Physical Activity: Not on file  Stress: Not on file  Social Connections: Unknown (07/04/2021)   Received from [facility], [facility]   Social Network    Social Network: Not on file  Intimate Partner Violence: Unknown (06/02/2021)   Received from [facility], [facility]   HITS    Physically Hurt: Not on file    Insult or Talk Down To: Not on file    Threaten Physical Harm: Not on file    Scream or Curse: Not on file    Family History  Problem Relation Age of Onset   Asthma Mother        hospitalization with exacerbation   Asthma Sister        hospitalization with exacerbation    Current Outpatient Medications  Medication Sig Dispense Refill   albuterol (PROVENTIL) (2.5 MG/3ML) 0.083% nebulizer solution Take 3 mLs (2.5 mg total) by nebulization every 6 (six) hours. DX: J45.909 360 mL 11   albuterol (VENTOLIN HFA) 108 (90 Base) MCG/ACT inhaler Inhale 2 puffs into Andrea lungs every 4 (four) hours as needed for wheezing or shortness  of breath. 7 g 3   atorvastatin (LIPITOR) 20 MG tablet 1 tablet     Biotin 1000 MCG tablet Take 1,000 mcg by mouth 3 (three) times daily.     Budeson-Glycopyrrol-Formoterol (BREZTRI AEROSPHERE) 160-9-4.8 MCG/ACT AERO Inhale 2 puffs into Andrea lungs in Andrea morning and at bedtime. 10.7 g 5   Budeson-Glycopyrrol-Formoterol (BREZTRI AEROSPHERE) 160-9-4.8 MCG/ACT AERO Inhale 2 puffs into Andrea lungs in Andrea morning and at bedtime. 10.7 g 28   Budeson-Glycopyrrol-Formoterol (BREZTRI AEROSPHERE) 160-9-4.8 MCG/ACT AERO Inhale 2 puffs into Andrea lungs in Andrea morning and at bedtime. 2 each 0   lisinopril (ZESTRIL) 10 MG tablet Take 1 tablet by mouth daily.     loratadine (CLARITIN) 10 MG tablet Take 10 mg by mouth daily.     metFORMIN (GLUCOPHAGE) 500 MG tablet 1 tablet with a meal     Multiple Vitamin (MULTIVITAMIN WITH MINERALS) TABS  tablet Take 1 tablet by mouth daily.     omega-3 acid ethyl esters (LOVAZA) 1 G capsule Take 1 capsule by mouth 2 (two) times daily.     Spacer/Aero-Holding Chambers (AEROCHAMBER MV) inhaler Use as instructed 1 each 0   No current facility-administered medications for this visit.    No Known Allergies   REVIEW OF SYSTEMS:  [X]  denotes positive finding, [ ]  denotes negative finding Cardiac  Comments:  Chest pain or chest pressure:    Shortness of breath upon exertion:    Short of breath when lying flat:    Irregular heart rhythm:        Vascular    Pain in calf, thigh, or hip brought on by ambulation:    Pain in feet at night that wakes you up from your sleep:     Blood clot in your veins:    Leg swelling:         Pulmonary    Oxygen at home:    Productive cough:     Wheezing:         Neurologic    Sudden weakness in arms or legs:     Sudden numbness in arms or legs:     Sudden onset of difficulty speaking or slurred speech:    Temporary loss of vision in one eye:     Problems with dizziness:         Gastrointestinal    Blood in stool:     Vomited blood:         Genitourinary    Burning when urinating:     Blood in urine:        Psychiatric    Major depression:         Hematologic    Bleeding problems:    Problems with blood clotting too easily:        Skin    Rashes or ulcers:        Constitutional    Fever or chills:      PHYSICAL EXAMINATION:  Vitals:   09/21/22 0931  BP: (!) 149/84  Pulse: 88  Resp: 18  Temp: 97.9 F (36.6 C)  TempSrc: Temporal  SpO2: 97%  Weight: 219 lb (99.3 kg)  Height: 5\' 4"  (1.626 m)    General:  WDWN; tearful  Gait: Not observed, in wheel chair HENT: WNL, normocephalic Pulmonary: normal non-labored breathing without wheezing Cardiac: regular HR Abdomen: soft, NT, no masses Vascular Exam/Pulses: Unable to palpate Andrea femoral pulses, Doppler right Peroneal and Townsend signals. Feet both warm. Motor decreased on right  secondary  to Andrea paralysis from prior CVA. Sensation intact. Palpable left DP/Townsend Extremities: without ischemic changes, without Gangrene , without cellulitis; without open wounds; right leg edematous from knee to foot Musculoskeletal: no muscle wasting or atrophy  Neurologic: A&O X 3 Psychiatric:  Andrea Townsend has  affect.   Non-Invasive Vascular Imaging:   +-------+-----------+-----------+------------+------------+  ABI/TBIToday's ABIToday's TBIPrevious ABIPrevious TBI  +-------+-----------+-----------+------------+------------+  Right 0.65       0.54       0.66        0.55          +-------+-----------+-----------+------------+------------+  Left  1.07       0.88       1.16        0.87          +-------+-----------+-----------+------------+------------+   +---------+---------------+---------+-----------+----------+---------------  --+  RIGHT   CompressibilityPhasicitySpontaneityPropertiesThrombus Aging      +---------+---------------+---------+-----------+----------+---------------  --+  CFV     Full           Yes      Yes                                      +---------+---------------+---------+-----------+----------+---------------  --+  SFJ     Full                                                             +---------+---------------+---------+-----------+----------+---------------  --+  FV Prox  Full                                                             +---------+---------------+---------+-----------+----------+---------------  --+  FV Mid                           Yes                                      +---------+---------------+---------+-----------+----------+---------------  --+  FV Distal               Yes      Yes                                      +---------+---------------+---------+-----------+----------+---------------  --+  PFV     Full                                                              +---------+---------------+---------+-----------+----------+---------------  --+  POP                    Yes      Yes                                      +---------+---------------+---------+-----------+----------+---------------  --+  PTV     None                    No                   Age  Indeterminate  +---------+---------------+---------+-----------+----------+---------------  --+  Gastroc None                    No                   Age  Indeterminate  +---------+---------------+---------+-----------+----------+---------------  --+    Right Technical Findings:  Not visualized segments include peroneal veins.     Left Technical Findings:  Not visualized segments include CFV.    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  - No cystic structure found in Andrea popliteal fossa.   ASSESSMENT/PLAN:: 62 y.o. female here evaluation of pain and swelling in Andrea right leg x 2 weeks. She has a known right external iliac artery occlusion. Three vessel runoff on prior CTA.  With Andrea symptoms I had high suspicion of DVT. I requested DVT study while patient was in our office toady. Andrea duplex shows age indeterminate thrombus in Andrea posterior tibial and gastrocnemius veins. I think this is likely what is contributing to Andrea current acute symptoms. Andrea ABI's today are essentially unchanged from Andrea prior study in March. Andrea foot is warm with doppler signals. I discussed with patient and Andrea Townsend that this is likely related to Andrea findings of Andrea DVT. I will treat Andrea with anticoagulation since she is non ambulatory and higher risk for progression of Andrea DVT.  - Xarelto starter pack sent to Andrea pharmacy. She will take 15 mg BID x 21 days then 20 mg after - Encourage Andrea to elevate Andrea legs when  sitting or in bed - She can take tylenol for pain  - Will have Andrea follow up in 4-6 weeks just to make sure Andrea symptoms are resolving - She knows to call for earlier follow up should she have worsening symptoms    Graceann Congress, PA-C [facility] [phone]  Clinic MD:   Karin Lieu

## 2022-10-02 DIAGNOSIS — N958 Other specified menopausal and perimenopausal disorders: Secondary | ICD-10-CM | POA: Diagnosis not present

## 2022-10-02 DIAGNOSIS — N398 Other specified disorders of urinary system: Secondary | ICD-10-CM | POA: Diagnosis not present

## 2022-10-02 DIAGNOSIS — N3281 Overactive bladder: Secondary | ICD-10-CM | POA: Diagnosis not present

## 2022-10-02 DIAGNOSIS — N3946 Mixed incontinence: Secondary | ICD-10-CM | POA: Diagnosis not present

## 2022-10-19 DIAGNOSIS — I10 Essential (primary) hypertension: Secondary | ICD-10-CM | POA: Diagnosis not present

## 2022-10-19 DIAGNOSIS — R7303 Prediabetes: Secondary | ICD-10-CM | POA: Diagnosis not present

## 2022-10-19 DIAGNOSIS — I69351 Hemiplegia and hemiparesis following cerebral infarction affecting right dominant side: Secondary | ICD-10-CM | POA: Diagnosis not present

## 2022-10-19 DIAGNOSIS — I739 Peripheral vascular disease, unspecified: Secondary | ICD-10-CM | POA: Diagnosis not present

## 2022-10-29 ENCOUNTER — Other Ambulatory Visit: Payer: Self-pay

## 2022-10-29 MED ORDER — RIVAROXABAN 20 MG PO TABS: 20.0000 mg | ORAL_TABLET | Freq: Every day | ORAL | 5 refills | Status: DC

## 2022-10-29 NOTE — Telephone Encounter (Signed)
Pt's daughter, Dorene Grebe, called requesting a refill on pt's blood thinner. She also states that the pt's BL feet and ankles are still very swollen.  Reviewed pt's chart, returned call for clarification, two identifiers used. No DPR and Dorene Grebe is not an emergency contact for pt. Spoke to husband, Clide Cliff, two identifiers used. He stated that he was unsure when the pt ran out of Xarelto. Informed him that a refill would be sent in and confirmed pharmacy. Encouraged pt to elevate several times daily and at HS above heart level to help with swelling. Pt next appt is 9/18. Confirmed understanding.

## 2022-11-02 ENCOUNTER — Ambulatory Visit: Payer: Medicare Other

## 2022-11-07 ENCOUNTER — Ambulatory Visit: Payer: Medicare Other | Admitting: Physician Assistant

## 2022-11-07 VITALS — BP 100/60 | HR 92 | Temp 98.0°F | Wt 219.0 lb

## 2022-11-07 DIAGNOSIS — I82441 Acute embolism and thrombosis of right tibial vein: Secondary | ICD-10-CM

## 2022-11-07 DIAGNOSIS — Z23 Encounter for immunization: Secondary | ICD-10-CM | POA: Diagnosis not present

## 2022-11-07 NOTE — Progress Notes (Signed)
Office Note   History of Present Illness   Andrea Townsend is a 62 y.o. (December 16, 1960) female who presents for DVT follow-up.  She has a history of CVA with paralysis of the right upper and lower extremities.  She presented to our office in August with constant right lower extremity pain and swelling for 2 weeks.  She was found to have age indeterminate DVT in the right posterior tibial and gastrocnemius veins.  She was started on Xarelto and set for follow-up.  The patient returns today for follow up.  Her husband provides most of the history given her aphasia.  He states her lower leg swelling is not much better, but her pain is.  She responds "yes" when asked if her right leg pain is better.  She has been wearing compression stockings.  She has not been elevating her legs and spends most of the day sitting in her wheelchair with her feet in the dependent position.  Current Outpatient Medications  Medication Sig Dispense Refill   albuterol (PROVENTIL) (2.5 MG/3ML) 0.083% nebulizer solution Take 3 mLs (2.5 mg total) by nebulization every 6 (six) hours. DX: J45.909 360 mL 11   albuterol (VENTOLIN HFA) 108 (90 Base) MCG/ACT inhaler Inhale 2 puffs into the lungs every 4 (four) hours as needed for wheezing or shortness of breath. 7 g 3   atorvastatin (LIPITOR) 20 MG tablet 1 tablet     Biotin 1000 MCG tablet Take 1,000 mcg by mouth 3 (three) times daily.     Budeson-Glycopyrrol-Formoterol (BREZTRI AEROSPHERE) 160-9-4.8 MCG/ACT AERO Inhale 2 puffs into the lungs in the morning and at bedtime. 10.7 g 5   Budeson-Glycopyrrol-Formoterol (BREZTRI AEROSPHERE) 160-9-4.8 MCG/ACT AERO Inhale 2 puffs into the lungs in the morning and at bedtime. 10.7 g 28   Budeson-Glycopyrrol-Formoterol (BREZTRI AEROSPHERE) 160-9-4.8 MCG/ACT AERO Inhale 2 puffs into the lungs in the morning and at bedtime. 2 each 0   lisinopril (ZESTRIL) 10 MG tablet Take 1 tablet by mouth daily.     loratadine (CLARITIN) 10 MG tablet  Take 10 mg by mouth daily.     metFORMIN (GLUCOPHAGE) 500 MG tablet 1 tablet with a meal     Multiple Vitamin (MULTIVITAMIN WITH MINERALS) TABS tablet Take 1 tablet by mouth daily.     omega-3 acid ethyl esters (LOVAZA) 1 G capsule Take 1 capsule by mouth 2 (two) times daily.     rivaroxaban (XARELTO) 20 MG TABS tablet Take 1 tablet (20 mg total) by mouth daily with supper. 30 tablet 5   Spacer/Aero-Holding Chambers (AEROCHAMBER MV) inhaler Use as instructed 1 each 0   No current facility-administered medications for this visit.    REVIEW OF SYSTEMS (negative unless checked):   Cardiac:  []  Chest pain or chest pressure? []  Shortness of breath upon activity? []  Shortness of breath when lying flat? []  Irregular heart rhythm?  Vascular:  []  Pain in calf, thigh, or hip brought on by walking? []  Pain in feet at night that wakes you up from your sleep? []  Blood clot in your veins? [x]  Leg swelling?  Pulmonary:  []  Oxygen at home? []  Productive cough? []  Wheezing?  Neurologic:  []  Sudden weakness in arms or legs? []  Sudden numbness in arms or legs? []  Sudden onset of difficult speaking or slurred speech? []  Temporary loss of vision in one eye? []  Problems with dizziness?  Gastrointestinal:  []  Blood in stool? []  Vomited blood?  Genitourinary:  []  Burning when urinating? []  Blood in urine?  Psychiatric:  []  Major depression  Hematologic:  []  Bleeding problems? []  Problems with blood clotting?  Dermatologic:  []  Rashes or ulcers?  Constitutional:  []  Fever or chills?  Ear/Nose/Throat:  []  Change in hearing? []  Nose bleeds? []  Sore throat?  Musculoskeletal:  []  Back pain? []  Joint pain? []  Muscle pain?   Physical Examination   Vitals:   11/07/22 1525  BP: 100/60  Pulse: 92  Temp: 98 F (36.7 C)  TempSrc: Temporal  SpO2: 94%  Weight: 219 lb (99.3 kg)   Body mass index is 37.59 kg/m.  General:  WDWN in NAD; vital signs documented above Gait: Not  observed HENT: WNL, normocephalic Pulmonary: normal non-labored breathing  Cardiac: regular Abdomen: soft, NT, no masses Skin: without rashes Vascular Exam/Pulses: right PT/Peroneal doppler signals. Left DP/PT doppler signals Extremities: bilateral lower legs 2+ edema Neurologic: A&O X 3   Medical Decision Making   Andrea Townsend is a 62 y.o. female who presents for DVT follow up  The patient was diagnosed with age-indeterminate thrombus in the right posterior tibial and gastrocnemius veins in August She is tolerating her Xarelto well.  Her right lower extremity swelling has not improved, however she spends most of the day with her feet in the dependent position. Her right lower extremity pain has improved.  She wears her bilateral lower leg compression stockings daily On exam she has intact doppler signals in both feet. Bilateral lower legs with 2+ edema I have encouraged the patient that she needs to elevate her legs throughout the day to help with fluid return. She should continue her Xarelto for another 6 months. We will see her again in 6 months with repeat RLE DVT study. At that time if her scan and symptoms have improved, we can consider discontinuing her anticoagulation   Loel Dubonnet PA-C [facility] [place] Office: [phone]  Clinic MD: Randie Heinz

## 2022-11-09 ENCOUNTER — Other Ambulatory Visit: Payer: Self-pay

## 2022-11-09 DIAGNOSIS — I82441 Acute embolism and thrombosis of right tibial vein: Secondary | ICD-10-CM

## 2022-11-13 DIAGNOSIS — N3946 Mixed incontinence: Secondary | ICD-10-CM | POA: Diagnosis not present

## 2022-11-13 DIAGNOSIS — Z1211 Encounter for screening for malignant neoplasm of colon: Secondary | ICD-10-CM | POA: Diagnosis not present

## 2022-11-20 DIAGNOSIS — R262 Difficulty in walking, not elsewhere classified: Secondary | ICD-10-CM | POA: Diagnosis not present

## 2022-11-20 DIAGNOSIS — I69351 Hemiplegia and hemiparesis following cerebral infarction affecting right dominant side: Secondary | ICD-10-CM | POA: Diagnosis not present

## 2022-11-20 DIAGNOSIS — I10 Essential (primary) hypertension: Secondary | ICD-10-CM | POA: Diagnosis not present

## 2022-11-26 DIAGNOSIS — Z789 Other specified health status: Secondary | ICD-10-CM | POA: Diagnosis not present

## 2022-11-26 DIAGNOSIS — N393 Stress incontinence (female) (male): Secondary | ICD-10-CM | POA: Diagnosis not present

## 2022-11-26 DIAGNOSIS — N3281 Overactive bladder: Secondary | ICD-10-CM | POA: Diagnosis not present

## 2022-11-26 DIAGNOSIS — N3941 Urge incontinence: Secondary | ICD-10-CM | POA: Diagnosis not present

## 2022-12-06 ENCOUNTER — Encounter: Payer: Self-pay | Admitting: Physical Therapy

## 2022-12-06 ENCOUNTER — Other Ambulatory Visit: Payer: Self-pay

## 2022-12-06 ENCOUNTER — Ambulatory Visit: Payer: Medicare Other | Attending: Internal Medicine | Admitting: Physical Therapy

## 2022-12-06 VITALS — BP 106/44 | HR 76

## 2022-12-06 DIAGNOSIS — M6281 Muscle weakness (generalized): Secondary | ICD-10-CM | POA: Diagnosis not present

## 2022-12-06 DIAGNOSIS — R2689 Other abnormalities of gait and mobility: Secondary | ICD-10-CM | POA: Diagnosis not present

## 2022-12-06 DIAGNOSIS — I69951 Hemiplegia and hemiparesis following unspecified cerebrovascular disease affecting right dominant side: Secondary | ICD-10-CM | POA: Diagnosis not present

## 2022-12-06 NOTE — Therapy (Signed)
OUTPATIENT PHYSICAL THERAPY NEURO EVALUATION   Patient Name: Andrea Townsend MRN: [id number] DOB:04-09-1960, 62 y.o., female Today's Date: 12/06/2022   PCP: Lorenda Ishihara, MD REFERRING PROVIDER: Lorenda Ishihara, MD  END OF SESSION:  12/06/22 1623  PT Visits / Re-Eval  Visit Number 1  Number of Visits 9 (8 + eval)  Date for PT Re-Evaluation 02/08/23 (pushed out due to scheduling delay and limited pt/caregiver schedule)  Authorization  Authorization Type [employer] MEDICARE  Progress Note Due on Visit 10  PT Time Calculation  PT Start Time 1615  PT Stop Time 1652  PT Time Calculation (min) 37 min  PT - End of Session  Equipment Utilized During Treatment Gait belt  Behavior During Therapy WFL for tasks assessed/performed (appears more expressively aphasic)    Past Medical History:  Diagnosis Date   Bronchitis    Diabetes mellitus without complication (HCC)    Hyperlipidemia    Hypertension    Stroke [facility])    Past Surgical History:  Procedure Laterality Date   ABDOMINAL SURGERY     CESAREAN SECTION     Patient Active Problem List   Diagnosis Date Noted   Abnormal Doppler ultrasound of carotid artery 04/25/2021   Allergic rhinitis due to pollen 04/25/2021   Aphasia as late effect of cerebrovascular accident 04/25/2021   Essential hypertension 04/25/2021   Generalized anxiety disorder 04/25/2021   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) 04/25/2021   Menopausal symptom 04/25/2021   Obesity 04/25/2021   Peripheral vascular disease (HCC) 04/25/2021   Personal history of transient ischemic attack (TIA), and cerebral infarction without residual deficits 04/25/2021   Ulcer of thigh (HCC) 04/25/2021   Urge incontinence of urine 04/25/2021   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) 04/25/2021   Exacerbation of asthma 09/11/2018   Asthma, chronic 11/10/2014   Allergic rhinitis 11/10/2014    Dyspnea 04/29/2014   Vocal cord dysfunction 04/29/2014   Cough 10/08/2013   Wheezing 10/08/2013   Dysphagia 09/22/2013   Acute respiratory failure (HCC) 09/21/2013   Sinus tachycardia 09/21/2013   Late effects of CVA (cerebrovascular accident) 09/21/2013   Leukocytosis 09/21/2013    ONSET DATE: 15 years ago (time of CVA)  REFERRING DIAG: R26.2 (ICD-10-CM) - Difficulty in walking, not elsewhere classified  THERAPY DIAG:  Hemiplegia and hemiparesis following unspecified cerebrovascular disease affecting right dominant side (HCC)  Other abnormalities of gait and mobility  Muscle weakness (generalized)  Rationale for Evaluation and Treatment: Rehabilitation  SUBJECTIVE:  SUBJECTIVE STATEMENT: Has been about 13-14 years since patient received PT services.  She stands at home per husband holding onto him or her shower bench handle.   Pt accompanied by: significant other - husband [name]  PERTINENT HISTORY: CVA (15 years ago - R hemi - has not ambulated since then), dysphagia, aphasia (more expressive?), HTN, GAD, PAD, TIA, urge incontinence of urine  PAIN:  Are you having pain? No  PRECAUTIONS: Fall  RED FLAGS: Bowel or bladder incontinence: Yes: wears depends; MD aware    WEIGHT BEARING RESTRICTIONS: No  FALLS: Has patient fallen in last 6 months? No  LIVING ENVIRONMENT: Lives with: lives with their spouse Lives in: House/apartment Stairs: No Has following equipment at home: Wheelchair (manual), shower chair, and Shower bench  PLOF: Needs assistance with ADLs, Needs assistance with homemaking, Needs assistance with gait, and Needs assistance with transfers  PATIENT GOALS: "to walk"  OBJECTIVE:  Note: Objective measures were completed at Evaluation unless otherwise  noted.  DIAGNOSTIC FINDINGS: None available at time of evaluation.  COGNITION: Overall cognitive status: History of cognitive impairments - at baseline   SENSATION: Unable to formally assess as pt unsure of how to respond  COORDINATION: BLE RAMS:  impaired due to lack of AROM BLE Heel-to-shin:  impaired on RLE due to hemiparesis and body habitus  EDEMA:  Pt has some intermittent ongoing swelling in BLE, on xarelto for DVT prevention as of 2 months ago  MUSCLE TONE: RLE: Hypertonic, Modifed Ashworth Scale 2 = More marked increase in muscle tone through most of the ROM, but affected part(s) easily moved, and Pt has clonus in RLE  POSTURE: rounded shoulders, forward head, posterior pelvic tilt, and weight shift left  LOWER EXTREMITY ROM:     Passive  Right Eval Left Eval  Hip flexion WNL if moved slowly; DF to neutral on RLE  Hip extension   Hip abduction   Hip adduction   Hip internal rotation   Hip external rotation   Knee flexion   Knee extension   Ankle dorsiflexion    Ankle plantarflexion    Ankle inversion    Ankle eversion     (Blank rows = not tested)  LOWER EXTREMITY MMT:    MMT Right Eval Left Eval  Hip flexion 0/5 4/5  Hip extension    Hip abduction    Hip adduction    Hip internal rotation    Hip external rotation    Knee flexion    Knee extension 3/5 4+/5  Ankle dorsiflexion 0/5 4+/5  Ankle plantarflexion    Ankle inversion    Ankle eversion    (Blank rows = not tested)  BED MOBILITY:  Sleeps in recliner - uses features of recliner for mobility  TRANSFERS: Assistive device utilized:  ballet bar   Sit to stand: CGA Stand to sit: CGA Chair to chair: CGA  GAIT:  Comments: Formal gait not assessed, pt steps forwards, backwards, and laterally left and right using LUE on // bars and spasticity appears to help pt in functional upright.  Mild left lean.  FUNCTIONAL TESTS:  5 times sit to stand: 29.06 sec LUE support and trunk lean  CGA  PATIENT SURVEYS:  FOTO N/A due to chronicity of CVA  TODAY'S TREATMENT:  DATE: NA - eval only.   PATIENT EDUCATION: Education details: PT POC, assessments used and to be used, and goals to be set.  Discussed potentially switching to [facility] vs her using shower chair handle to step at home and potential of gait training for household distances w/ device.  Discussed potential need for managing tone in future if it begins to interfere with transfers - referral to MD for this when needed.  Discussed referral requests for new manual chair, ST/OT as pt and husband prefer.  Bracing assessment in future. Person educated: Patient and Caregiver husband Passenger transport manager method: Explanation Education comprehension: verbalized understanding and needs further education  HOME EXERCISE PROGRAM: To be established.  GOALS: Goals reviewed with patient? Yes  SHORT TERM GOALS: Target date: 01/04/2023  Pt will be independent and compliant with initial standing balance and strengthening HEP in order to maintain functional progress and improve mobility. Baseline:  To be established. Goal status: INITIAL  2.  Pt will perform stand step transfer using LRAD with no more than supervision in order to improve household mobility and safety with functional transfers. Baseline:  CGA Goal status: INITIAL  3.  PT to assess need for and benefit from various bracing options including AFO with order request placed as needed. Baseline:  To be assessed. Goal status: INITIAL  4.  Pt will ambulate >/=25 feet with LRAD and no more than CGA level of assist to promote household and community access. Baseline: CGA to multidirectionally step at ballet bar Goal status: INITIAL  LONG TERM GOALS: Target date: 02/01/2023  Pt will be independent and compliant with advanced strength and balance HEP in order  to maintain functional progress and improve mobility. Baseline:  To be established. Goal status: INITIAL  2.  Pt will ambulate >/=115 feet with LRAD and no more than minA level of assist to promote household and community access. Baseline: CGA to multidirectionally step at ballet bar Goal status: INITIAL  3.  Pt will decrease 5xSTS to </=24.06 seconds in order to demonstrate decreased risk for falls and improved functional bilateral LE strength and power. Baseline: 29.06 sec w/ left trunk lean CGA Goal status: INITIAL  4.  Pt will demonstrate standing tolerance of >/=10 minutes with UE support as needed to demonstrate improved activity tolerance. Baseline: To be assessed. Goal status: INITIAL  5.  Patient to stand to AD w/ no more than SBA and improved midline upright in order to demonstrate safe initiation of ambulation. Baseline: CGA w/ heavy left reliance and trunk lean Goal status: INITIAL  ASSESSMENT:  CLINICAL IMPRESSION: Patient is a 62 y.o. female who was seen today for physical therapy evaluation and treatment for chronic right hemiparesis following CVA.  Pt has a significant PMH of CVA (15 years ago - R hemi - has not ambulated since then), dysphagia, aphasia (more expressive?), HTN, GAD, PAD, TIA, urge incontinence of urine.  Identified impairments include impaired coordination due to decreased R AROM, intermittent BLE edema, RLE spasticity and clonus, left weight shift and PPT in sitting, limited DF to neutral passively, RLE functional weakness relying on tone to functionally stand and step, left trunk lean in standing and stepping.  Evaluation via the following assessment tools: 5xSTS indicates high fall risk.  She would benefit from skilled PT to address impairments as noted and progress towards long term goals.  OBJECTIVE IMPAIRMENTS: Abnormal gait, decreased activity tolerance, decreased balance, decreased cognition, decreased coordination, decreased endurance, decreased  knowledge of use of DME, decreased mobility, difficulty walking,  decreased ROM, decreased strength, decreased safety awareness, increased edema, impaired flexibility, impaired tone, impaired UE functional use, improper body mechanics, and postural dysfunction.   ACTIVITY LIMITATIONS: carrying, lifting, bending, sitting, standing, squatting, stairs, transfers, bed mobility, continence, bathing, toileting, dressing, reach over head, hygiene/grooming, locomotion level, and caring for others  PARTICIPATION LIMITATIONS: meal prep, cleaning, laundry, medication management, personal finances, interpersonal relationship, driving, shopping, and community activity  PERSONAL FACTORS: Age, Fitness, Past/current experiences, Time since onset of injury/illness/exacerbation, and 1-2 comorbidities: HTN, non-functional ambulator x15 years  are also affecting patient's functional outcome.   REHAB POTENTIAL: Fair See PMH and personal factors.  CLINICAL DECISION MAKING: Stable/uncomplicated  EVALUATION COMPLEXITY: Low  PLAN:  PT FREQUENCY: 1x/week (preference to include husband's work schedule)  PT DURATION: 8 weeks  PLANNED INTERVENTIONS: 97164- PT Re-evaluation, 97110-Therapeutic exercises, 97530- Therapeutic activity, 97112- Neuromuscular re-education, 97535- Self Care, 78295- Manual therapy, [phone]- Gait training, [phone]- Orthotic Fit/training, Patient/Family education, Balance training, Stair training, Joint mobilization, Vestibular training, DME instructions, and Wheelchair mobility training  PLAN FOR NEXT SESSION: Stand step transfers with HW, initiate gait training in // bars > HW w/ +2 follow?  AFO?  Have we received referrals requested?  Functional strengthening and caregiver ROM HEP.   Sadie Haber, PT, DPT 12/06/2022, 5:00 PM

## 2022-12-11 ENCOUNTER — Telehealth: Payer: Self-pay | Admitting: Physical Therapy

## 2022-12-11 NOTE — Telephone Encounter (Signed)
PT called Dr. Irish Elders office [facility] [place] and spoke to 4 receptionists, one referral coordinator, and briefly to the nurse who placed PT on hold and then line went dead.  PT attempting to place OT/ST referral requests - referral coordinator informed PT she does not do this and it would have to be from the nurse.  Pt also in need of manual wheelchair order.  Will re-attempt if able otherwise may have to defer to patient caregiver to contact PCP.  Camille Bal, PT, DPT

## 2022-12-13 ENCOUNTER — Other Ambulatory Visit: Payer: Self-pay | Admitting: Nurse Practitioner

## 2022-12-13 DIAGNOSIS — J4540 Moderate persistent asthma, uncomplicated: Secondary | ICD-10-CM

## 2022-12-13 MED ORDER — BREZTRI AEROSPHERE 160-9-4.8 MCG/ACT IN AERO: 2.0000 | INHALATION_SPRAY | Freq: Two times a day (BID) | RESPIRATORY_TRACT | 28 refills | Status: DC

## 2022-12-13 NOTE — Progress Notes (Signed)
[facility] needs updated rx for Breztri. Printed, signed and faxed. Needs OV with Dr. Francine Graven in 03/2023.

## 2022-12-14 ENCOUNTER — Ambulatory Visit: Payer: Medicare Other | Admitting: Physical Therapy

## 2022-12-14 ENCOUNTER — Encounter: Payer: Self-pay | Admitting: Physical Therapy

## 2022-12-14 DIAGNOSIS — R2689 Other abnormalities of gait and mobility: Secondary | ICD-10-CM | POA: Diagnosis not present

## 2022-12-14 DIAGNOSIS — M6281 Muscle weakness (generalized): Secondary | ICD-10-CM | POA: Diagnosis not present

## 2022-12-14 DIAGNOSIS — I69951 Hemiplegia and hemiparesis following unspecified cerebrovascular disease affecting right dominant side: Secondary | ICD-10-CM | POA: Diagnosis not present

## 2022-12-14 NOTE — Patient Instructions (Signed)
Access Code: [email or web address] URL: [email or web address] Date: 12/14/2022 Prepared by: Camille Bal  Exercises - Supine Ankle Dorsiflexion Stretch with Caregiver  - 1 x daily - 7 x weekly - 1 sets - 3 reps - 45 seconds hold - Hip and Knee Extension and Flexion Caregiver PROM  - 1 x daily - 7 x weekly - 1 sets - 3 reps - 45 seconds hold - Hip Internal and External Rotation Caregiver PROM  - 1 x daily - 7 x weekly - 1 sets - 10 reps - Hip Abduction and Adduction Caregiver PROM  - 1 x daily - 7 x weekly - 1 sets - 10 reps - Side to Side Weight Shift with Counter Support  - 1 x daily - 7 x weekly - 3 sets - 10 reps - Forward Backward Weight Shift with Counter Support  - 1 x daily - 7 x weekly - 3 sets - 10 reps

## 2022-12-14 NOTE — Therapy (Signed)
OUTPATIENT PHYSICAL THERAPY NEURO TREATMENT   Patient Name: MYASIA ALESSI MRN: [id number] DOB:08/23/1960, 62 y.o., female Today's Date: 12/14/2022   PCP: Lorenda Ishihara, MD REFERRING PROVIDER: Lorenda Ishihara, MD  END OF SESSION:  PT End of Session - 12/14/22 1539     Visit Number 2    Number of Visits 9   8 + eval   Date for PT Re-Evaluation 02/08/23   pushed out due to scheduling delay and limited pt/caregiver schedule   Authorization Type [employer] MEDICARE    Progress Note Due on Visit 10    PT Start Time 1532    PT Stop Time 1615    PT Time Calculation (min) 43 min    Equipment Utilized During Treatment Gait belt    Activity Tolerance Patient tolerated treatment well    Behavior During Therapy WFL for tasks assessed/performed   appears more expressively aphasic           Past Medical History:  Diagnosis Date   Bronchitis    Diabetes mellitus without complication (HCC)    Hyperlipidemia    Hypertension    Stroke [facility])    Past Surgical History:  Procedure Laterality Date   ABDOMINAL SURGERY     CESAREAN SECTION     Patient Active Problem List   Diagnosis Date Noted   Abnormal Doppler ultrasound of carotid artery 04/25/2021   Allergic rhinitis due to pollen 04/25/2021   Aphasia as late effect of cerebrovascular accident 04/25/2021   Essential hypertension 04/25/2021   Generalized anxiety disorder 04/25/2021   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) 04/25/2021   Menopausal symptom 04/25/2021   Obesity 04/25/2021   Peripheral vascular disease (HCC) 04/25/2021   Personal history of transient ischemic attack (TIA), and cerebral infarction without residual deficits 04/25/2021   Ulcer of thigh (HCC) 04/25/2021   Urge incontinence of urine 04/25/2021   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) 04/25/2021   Exacerbation of asthma 09/11/2018   Asthma, chronic 11/10/2014   Allergic  rhinitis 11/10/2014   Dyspnea 04/29/2014   Vocal cord dysfunction 04/29/2014   Cough 10/08/2013   Wheezing 10/08/2013   Dysphagia 09/22/2013   Acute respiratory failure (HCC) 09/21/2013   Sinus tachycardia 09/21/2013   Late effects of CVA (cerebrovascular accident) 09/21/2013   Leukocytosis 09/21/2013    ONSET DATE: 15 years ago (time of CVA)  REFERRING DIAG: R26.2 (ICD-10-CM) - Difficulty in walking, not elsewhere classified  THERAPY DIAG:  Hemiplegia and hemiparesis following unspecified cerebrovascular disease affecting right dominant side (HCC)  Other abnormalities of gait and mobility  Muscle weakness (generalized)  Rationale for Evaluation and Treatment: Rehabilitation  SUBJECTIVE:  SUBJECTIVE STATEMENT: Husband denies falls or acute changes.  He stays in lobby today.  Pt denies pain.  Pt accompanied by: significant other - husband [name] (in lobby)  PERTINENT HISTORY: CVA (15 years ago - R hemi - has not ambulated since then), dysphagia, aphasia (more expressive?), HTN, GAD, PAD, TIA, urge incontinence of urine  PAIN:  Are you having pain? No  PRECAUTIONS: Fall  RED FLAGS: Bowel or bladder incontinence: Yes: wears depends; MD aware    WEIGHT BEARING RESTRICTIONS: No  FALLS: Has patient fallen in last 6 months? No  LIVING ENVIRONMENT: Lives with: lives with their spouse Lives in: House/apartment Stairs: No Has following equipment at home: Wheelchair (manual), shower chair, and Shower bench  PLOF: Needs assistance with ADLs, Needs assistance with homemaking, Needs assistance with gait, and Needs assistance with transfers  PATIENT GOALS: "to walk"  OBJECTIVE:  Note: Objective measures were completed at Evaluation unless otherwise noted.  DIAGNOSTIC FINDINGS: None available  at time of evaluation.  COGNITION: Overall cognitive status: History of cognitive impairments - at baseline   SENSATION: Unable to formally assess as pt unsure of how to respond  COORDINATION: BLE RAMS:  impaired due to lack of AROM BLE Heel-to-shin:  impaired on RLE due to hemiparesis and body habitus  EDEMA:  Pt has some intermittent ongoing swelling in BLE, on xarelto for DVT prevention as of 2 months ago  MUSCLE TONE: RLE: Hypertonic, Modifed Ashworth Scale 2 = More marked increase in muscle tone through most of the ROM, but affected part(s) easily moved, and Pt has clonus in RLE  POSTURE: rounded shoulders, forward head, posterior pelvic tilt, and weight shift left  LOWER EXTREMITY ROM:     Passive  Right Eval Left Eval  Hip flexion WNL if moved slowly; DF to neutral on RLE  Hip extension   Hip abduction   Hip adduction   Hip internal rotation   Hip external rotation   Knee flexion   Knee extension   Ankle dorsiflexion    Ankle plantarflexion    Ankle inversion    Ankle eversion     (Blank rows = not tested)  LOWER EXTREMITY MMT:    MMT Right Eval Left Eval  Hip flexion 0/5 4/5  Hip extension    Hip abduction    Hip adduction    Hip internal rotation    Hip external rotation    Knee flexion    Knee extension 3/5 4+/5  Ankle dorsiflexion 0/5 4+/5  Ankle plantarflexion    Ankle inversion    Ankle eversion    (Blank rows = not tested)  BED MOBILITY:  Sleeps in recliner - uses features of recliner for mobility  TRANSFERS: Assistive device utilized:  ballet bar   Sit to stand: CGA Stand to sit: CGA Chair to chair: CGA  GAIT:  Comments: Formal gait not assessed, pt steps forwards, backwards, and laterally left and right using LUE on // bars and spasticity appears to help pt in functional upright.  Mild left lean.  FUNCTIONAL TESTS:  5 times sit to stand: 29.06 sec LUE support and trunk lean CGA  PATIENT SURVEYS:  FOTO N/A due to chronicity of  CVA  TODAY'S TREATMENT:  DATE: 12/14/2022 -Time spent discussing wheelchair referral vs wheelchair order request, husband wants to pursue wheelchair evaluation to see if pt could benefit from better quality manual chair.  Did explain to pt whom verbalizes "yes" repeatedly.  Updated husband and pt on process of obtaining all requested referrals since evaluation. -Pt did not want to lay semi-recumbent on wedge today for PROM and stretching so attempted in sitting, pt has increased extensor tone so additional time spent holding positions and positioning patient to assist with tone management.  Established PROM HEP for when pt in recliner at home. -Lateral and A/P standing weight shifts at sink several reps performed for tone management with PT facilitating pelvic and trunk initiation of movement vs shoulder/trunk lean. -Lateral stepping using sink for LUE grab 4x2 ft each direction, pt has functional right hip hike to assist w/ clearing R foot. -Left and right stand step transfers w/ [facility] CGA and PT blocking standard chair to prevent tipping on standing w/ push.  She demos good management of HW w/ min cues.  Extensor tone maintaining knee extension during stance.     PATIENT EDUCATION: Education details: Informed of ongoing attempts to obtain referrals.  Initial HEP and guarding. Person educated: Patient and Caregiver husband Passenger transport manager method: Explanation Education comprehension: verbalized understanding and needs further education  HOME EXERCISE PROGRAM: Access Code: [email or web address] URL: [email or web address] Date: 12/14/2022 Prepared by: Camille Bal  Exercises - Supine Ankle Dorsiflexion Stretch with Caregiver  - 1 x daily - 7 x weekly - 1 sets - 3 reps - 45 seconds hold - Hip and Knee Extension and Flexion Caregiver PROM  - 1 x daily - 7 x weekly - 1  sets - 3 reps - 45 seconds hold - Hip Internal and External Rotation Caregiver PROM  - 1 x daily - 7 x weekly - 1 sets - 10 reps - Hip Abduction and Adduction Caregiver PROM  - 1 x daily - 7 x weekly - 1 sets - 10 reps - Side to Side Weight Shift with Counter Support  - 1 x daily - 7 x weekly - 3 sets - 10 reps - Forward Backward Weight Shift with Counter Support  - 1 x daily - 7 x weekly - 3 sets - 10 reps  GOALS: Goals reviewed with patient? Yes  SHORT TERM GOALS: Target date: 01/04/2023  Pt will be independent and compliant with initial standing balance and strengthening HEP in order to maintain functional progress and improve mobility. Baseline:  To be established. Goal status: INITIAL  2.  Pt will perform stand step transfer using LRAD with no more than supervision in order to improve household mobility and safety with functional transfers. Baseline:  CGA Goal status: INITIAL  3.  PT to assess need for and benefit from various bracing options including AFO with order request placed as needed. Baseline:  To be assessed. Goal status: INITIAL  4.  Pt will ambulate >/=25 feet with LRAD and no more than CGA level of assist to promote household and community access. Baseline: CGA to multidirectionally step at ballet bar Goal status: INITIAL  LONG TERM GOALS: Target date: 02/01/2023  Pt will be independent and compliant with advanced strength and balance HEP in order to maintain functional progress and improve mobility. Baseline:  To be established. Goal status: INITIAL  2.  Pt will ambulate >/=115 feet with LRAD and no more than minA level of assist to promote household and community access. Baseline: CGA to multidirectionally step at ballet  bar Goal status: INITIAL  3.  Pt will decrease 5xSTS to </=24.06 seconds in order to demonstrate decreased risk for falls and improved functional bilateral LE strength and power. Baseline: 29.06 sec w/ left trunk lean CGA Goal status:  INITIAL  4.  Pt will demonstrate standing tolerance of >/=10 minutes with UE support as needed to demonstrate improved activity tolerance. Baseline: To be assessed. Goal status: INITIAL  5.  Patient to stand to AD w/ no more than SBA and improved midline upright in order to demonstrate safe initiation of ambulation. Baseline: CGA w/ heavy left reliance and trunk lean Goal status: INITIAL  ASSESSMENT:  CLINICAL IMPRESSION: Emphasis of skilled session today on initially establishing PROM and standing program to manage tone in RLE.  Progressed to stand step transfers with [facility] w/ pt needing no more than CGA and min cues for AD placement.  She demonstrates good safety awareness throughout session and tone appears to benefit standing stability and stepping potential.  Pt may benefit from progression to walking with HW in future sessions with goal of short household distances.  OBJECTIVE IMPAIRMENTS: Abnormal gait, decreased activity tolerance, decreased balance, decreased cognition, decreased coordination, decreased endurance, decreased knowledge of use of DME, decreased mobility, difficulty walking, decreased ROM, decreased strength, decreased safety awareness, increased edema, impaired flexibility, impaired tone, impaired UE functional use, improper body mechanics, and postural dysfunction.   ACTIVITY LIMITATIONS: carrying, lifting, bending, sitting, standing, squatting, stairs, transfers, bed mobility, continence, bathing, toileting, dressing, reach over head, hygiene/grooming, locomotion level, and caring for others  PARTICIPATION LIMITATIONS: meal prep, cleaning, laundry, medication management, personal finances, interpersonal relationship, driving, shopping, and community activity  PERSONAL FACTORS: Age, Fitness, Past/current experiences, Time since onset of injury/illness/exacerbation, and 1-2 comorbidities: HTN, non-functional ambulator x15 years  are also affecting patient's functional  outcome.   REHAB POTENTIAL: Fair See PMH and personal factors.  CLINICAL DECISION MAKING: Stable/uncomplicated  EVALUATION COMPLEXITY: Low  PLAN:  PT FREQUENCY: 1x/week (preference to include husband's work schedule)  PT DURATION: 8 weeks  PLANNED INTERVENTIONS: 97164- PT Re-evaluation, 97110-Therapeutic exercises, 97530- Therapeutic activity, 97112- Neuromuscular re-education, 97535- Self Care, 53664- Manual therapy, [phone]- Gait training, [phone]- Orthotic Fit/training, Patient/Family education, Balance training, Stair training, Joint mobilization, Vestibular training, DME instructions, and Wheelchair mobility training  PLAN FOR NEXT SESSION: Stand step transfers with HW, initiate gait training in // bars > HW w/ +2 follow?  AFO - stand step transfers?  Have we received referrals requested?  Functional strengthening and caregiver ROM HEP.  Lateral stepping at sink to HEP?  Sink HEP?   Sadie Haber, PT, DPT 12/14/2022, 4:39 PM

## 2022-12-20 ENCOUNTER — Encounter: Payer: Self-pay | Admitting: Physical Therapy

## 2022-12-20 ENCOUNTER — Ambulatory Visit: Payer: Medicare Other | Admitting: Physical Therapy

## 2022-12-20 DIAGNOSIS — R2689 Other abnormalities of gait and mobility: Secondary | ICD-10-CM

## 2022-12-20 DIAGNOSIS — I69951 Hemiplegia and hemiparesis following unspecified cerebrovascular disease affecting right dominant side: Secondary | ICD-10-CM | POA: Diagnosis not present

## 2022-12-20 DIAGNOSIS — M6281 Muscle weakness (generalized): Secondary | ICD-10-CM | POA: Diagnosis not present

## 2022-12-20 NOTE — Therapy (Signed)
OUTPATIENT PHYSICAL THERAPY NEURO TREATMENT   Patient Name: Andrea Townsend MRN: [id number] DOB:30-Jun-1960, 63 y.o., female Today's Date: 12/20/2022   PCP: Lorenda Ishihara, MD REFERRING PROVIDER: Lorenda Ishihara, MD  END OF SESSION:  PT End of Session - 12/20/22 1621     Visit Number 3    Number of Visits 9   8 + eval   Date for PT Re-Evaluation 02/08/23   pushed out due to scheduling delay and limited pt/caregiver schedule   Authorization Type [employer] MEDICARE    Progress Note Due on Visit 10    PT Start Time 1616    PT Stop Time 1700    PT Time Calculation (min) 44 min    Equipment Utilized During Treatment Gait belt    Activity Tolerance Patient tolerated treatment well    Behavior During Therapy WFL for tasks assessed/performed   appears more expressively aphasic           Past Medical History:  Diagnosis Date   Bronchitis    Diabetes mellitus without complication (HCC)    Hyperlipidemia    Hypertension    Stroke [facility])    Past Surgical History:  Procedure Laterality Date   ABDOMINAL SURGERY     CESAREAN SECTION     Patient Active Problem List   Diagnosis Date Noted   Abnormal Doppler ultrasound of carotid artery 04/25/2021   Allergic rhinitis due to pollen 04/25/2021   Aphasia as late effect of cerebrovascular accident 04/25/2021   Essential hypertension 04/25/2021   Generalized anxiety disorder 04/25/2021   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) 04/25/2021   Menopausal symptom 04/25/2021   Obesity 04/25/2021   Peripheral vascular disease (HCC) 04/25/2021   Personal history of transient ischemic attack (TIA), and cerebral infarction without residual deficits 04/25/2021   Ulcer of thigh (HCC) 04/25/2021   Urge incontinence of urine 04/25/2021   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) 04/25/2021   Exacerbation of asthma 09/11/2018   Asthma, chronic 11/10/2014   Allergic  rhinitis 11/10/2014   Dyspnea 04/29/2014   Vocal cord dysfunction 04/29/2014   Cough 10/08/2013   Wheezing 10/08/2013   Dysphagia 09/22/2013   Acute respiratory failure (HCC) 09/21/2013   Sinus tachycardia 09/21/2013   Late effects of CVA (cerebrovascular accident) 09/21/2013   Leukocytosis 09/21/2013    ONSET DATE: 15 years ago (time of CVA)  REFERRING DIAG: R26.2 (ICD-10-CM) - Difficulty in walking, not elsewhere classified  THERAPY DIAG:  Hemiplegia and hemiparesis following unspecified cerebrovascular disease affecting right dominant side (HCC)  Other abnormalities of gait and mobility  Muscle weakness (generalized)  Rationale for Evaluation and Treatment: Rehabilitation  SUBJECTIVE:  SUBJECTIVE STATEMENT: Husband denies falls or acute changes.  He attends session today at pt request.  Pt denies pain.  Pt accompanied by: significant other - husband [name] (in lobby)  PERTINENT HISTORY: CVA (15 years ago - R hemi - has not ambulated since then), dysphagia, aphasia (more expressive?), HTN, GAD, PAD, TIA, urge incontinence of urine  PAIN:  Are you having pain? No  PRECAUTIONS: Fall  RED FLAGS: Bowel or bladder incontinence: Yes: wears depends; MD aware    WEIGHT BEARING RESTRICTIONS: No  FALLS: Has patient fallen in last 6 months? No  LIVING ENVIRONMENT: Lives with: lives with their spouse Lives in: House/apartment Stairs: No Has following equipment at home: Wheelchair (manual), shower chair, and Shower bench  PLOF: Needs assistance with ADLs, Needs assistance with homemaking, Needs assistance with gait, and Needs assistance with transfers  PATIENT GOALS: "to walk"  OBJECTIVE:  Note: Objective measures were completed at Evaluation unless otherwise noted.  DIAGNOSTIC FINDINGS:  None available at time of evaluation.  COGNITION: Overall cognitive status: History of cognitive impairments - at baseline   SENSATION: Unable to formally assess as pt unsure of how to respond  COORDINATION: BLE RAMS:  impaired due to lack of AROM BLE Heel-to-shin:  impaired on RLE due to hemiparesis and body habitus  EDEMA:  Pt has some intermittent ongoing swelling in BLE, on xarelto for DVT prevention as of 2 months ago  MUSCLE TONE: RLE: Hypertonic, Modifed Ashworth Scale 2 = More marked increase in muscle tone through most of the ROM, but affected part(s) easily moved, and Pt has clonus in RLE  POSTURE: rounded shoulders, forward head, posterior pelvic tilt, and weight shift left  LOWER EXTREMITY ROM:     Passive  Right Eval Left Eval  Hip flexion WNL if moved slowly; DF to neutral on RLE  Hip extension   Hip abduction   Hip adduction   Hip internal rotation   Hip external rotation   Knee flexion   Knee extension   Ankle dorsiflexion    Ankle plantarflexion    Ankle inversion    Ankle eversion     (Blank rows = not tested)  LOWER EXTREMITY MMT:    MMT Right Eval Left Eval  Hip flexion 0/5 4/5  Hip extension    Hip abduction    Hip adduction    Hip internal rotation    Hip external rotation    Knee flexion    Knee extension 3/5 4+/5  Ankle dorsiflexion 0/5 4+/5  Ankle plantarflexion    Ankle inversion    Ankle eversion    (Blank rows = not tested)  BED MOBILITY:  Sleeps in recliner - uses features of recliner for mobility  TRANSFERS: Assistive device utilized:  ballet bar   Sit to stand: CGA Stand to sit: CGA Chair to chair: CGA  GAIT:  Comments: Formal gait not assessed, pt steps forwards, backwards, and laterally left and right using LUE on // bars and spasticity appears to help pt in functional upright.  Mild left lean.  FUNCTIONAL TESTS:  5 times sit to stand: 29.06 sec LUE support and trunk lean CGA  PATIENT SURVEYS:  FOTO N/A due to  chronicity of CVA  TODAY'S TREATMENT:  DATE: 12/20/2022 -Informed husband of ongoing attempts to obtain referrals discussed on eval (OT/ST/Wheelchair eval) - PT educates on caution standing from current wheelchair with left brake appearing loose - did recommend contacting wheelchair vendor to have this repaired in meantime - they are uncertain who they originally obtained this W/C from -Extensive discussion about goals of therapy and pt progress as pt nudges husband to speak for her and he inquires about if pt is making progress or is expected to make progress.  He states pt prefers ambulating with shower bench by dragging this backwards.  PT discussed how we have been working towards [facility] use and functional independence as this device can go with pt out of house vs bench and she can look forward when walking providing improved safety.  Pt acknowledges she does not like HW due to feeling unstable.  PT encourages her that practice will likely help with this.  Discussed other goals to work on like tone management and weight shifting to improve balance and strength in RLE.  She is open to this, but reluctant to practice with HW initially this visit.  Husband encourages her to try and pt agrees to continue. -Trialed HW for 8 ft of distance w/ husband providing wheelchair follow.  Pt reporting fatigue with task needing to sit at end of distance.  She has small step size and increased RLE mildly pushes hips to left.  CGA.  Pt did not want to try device further so regressed to counter exercise. -Standing at sink pt performs crossbody cone moving using LUE x2 rounds each direction > reach to cabinet, cued to prevent using pelvis to balance against counter, pt fatigue quickly when PT begins facilitating R weight shift during reach - some right trunk rotation compensation noted when trying to  actively weight shift > lateral rotation to look over shoulder x10 each side using LUE support > 2" LLE step taps w/ forward lean onto left forearm due to pt fear and R weakness  PATIENT EDUCATION: Education details: Informed of ongoing attempts to obtain referrals.  Continue HEP.  Goals and benefits of therapy - discussed collaborative nature of therapy and pt desires into goal making.  Will further discuss HW in future sessions as pt is unsure she wants to work towards this as a goal due to fear of falling. Person educated: Patient and Caregiver husband Passenger transport manager method: Explanation Education comprehension: verbalized understanding and needs further education  HOME EXERCISE PROGRAM: Access Code: [email or web address] URL: [email or web address] Date: 12/14/2022 Prepared by: Camille Bal  Exercises - Supine Ankle Dorsiflexion Stretch with Caregiver  - 1 x daily - 7 x weekly - 1 sets - 3 reps - 45 seconds hold - Hip and Knee Extension and Flexion Caregiver PROM  - 1 x daily - 7 x weekly - 1 sets - 3 reps - 45 seconds hold - Hip Internal and External Rotation Caregiver PROM  - 1 x daily - 7 x weekly - 1 sets - 10 reps - Hip Abduction and Adduction Caregiver PROM  - 1 x daily - 7 x weekly - 1 sets - 10 reps - Side to Side Weight Shift with Counter Support  - 1 x daily - 7 x weekly - 3 sets - 10 reps - Forward Backward Weight Shift with Counter Support  - 1 x daily - 7 x weekly - 3 sets - 10 reps  GOALS: Goals reviewed with patient? Yes  SHORT TERM GOALS: Target date: 01/04/2023  Pt will be independent  and compliant with initial standing balance and strengthening HEP in order to maintain functional progress and improve mobility. Baseline:  To be established. Goal status: INITIAL  2.  Pt will perform stand step transfer using LRAD with no more than supervision in order to improve household mobility and safety with functional transfers. Baseline:  CGA Goal status: INITIAL  3.   PT to assess need for and benefit from various bracing options including AFO with order request placed as needed. Baseline:  To be assessed. Goal status: INITIAL  4.  Pt will ambulate >/=25 feet with LRAD and no more than CGA level of assist to promote household and community access. Baseline: CGA to multidirectionally step at ballet bar Goal status: INITIAL  LONG TERM GOALS: Target date: 02/01/2023  Pt will be independent and compliant with advanced strength and balance HEP in order to maintain functional progress and improve mobility. Baseline:  To be established. Goal status: INITIAL  2.  Pt will ambulate >/=115 feet with LRAD and no more than minA level of assist to promote household and community access. Baseline: CGA to multidirectionally step at ballet bar Goal status: INITIAL  3.  Pt will decrease 5xSTS to </=24.06 seconds in order to demonstrate decreased risk for falls and improved functional bilateral LE strength and power. Baseline: 29.06 sec w/ left trunk lean CGA Goal status: INITIAL  4.  Pt will demonstrate standing tolerance of >/=10 minutes with UE support as needed to demonstrate improved activity tolerance. Baseline: To be assessed. Goal status: INITIAL  5.  Patient to stand to AD w/ no more than SBA and improved midline upright in order to demonstrate safe initiation of ambulation. Baseline: CGA w/ heavy left reliance and trunk lean Goal status: INITIAL  ASSESSMENT:  CLINICAL IMPRESSION: Session limited by patient expressed concerns requiring in depth conversation to collaborate with pt and husband regarding functional goals for patient progress.  Pt is uncertain of progressing to [facility] because she prefers the shower chair, but PT shared safety and functional independence concerns this visit.  She continues to benefit from skilled PT to address ambulatory tolerance, functional independence, and tone management to improve mechanics of upright mobility.  PT will  continue to address right hemiparesis impacting these factors in coming sessions.  OBJECTIVE IMPAIRMENTS: Abnormal gait, decreased activity tolerance, decreased balance, decreased cognition, decreased coordination, decreased endurance, decreased knowledge of use of DME, decreased mobility, difficulty walking, decreased ROM, decreased strength, decreased safety awareness, increased edema, impaired flexibility, impaired tone, impaired UE functional use, improper body mechanics, and postural dysfunction.   ACTIVITY LIMITATIONS: carrying, lifting, bending, sitting, standing, squatting, stairs, transfers, bed mobility, continence, bathing, toileting, dressing, reach over head, hygiene/grooming, locomotion level, and caring for others  PARTICIPATION LIMITATIONS: meal prep, cleaning, laundry, medication management, personal finances, interpersonal relationship, driving, shopping, and community activity  PERSONAL FACTORS: Age, Fitness, Past/current experiences, Time since onset of injury/illness/exacerbation, and 1-2 comorbidities: HTN, non-functional ambulator x15 years  are also affecting patient's functional outcome.   REHAB POTENTIAL: Fair See PMH and personal factors.  CLINICAL DECISION MAKING: Stable/uncomplicated  EVALUATION COMPLEXITY: Low  PLAN:  PT FREQUENCY: 1x/week (preference to include husband's work schedule)  PT DURATION: 8 weeks  PLANNED INTERVENTIONS: 97164- PT Re-evaluation, 97110-Therapeutic exercises, 97530- Therapeutic activity, 97112- Neuromuscular re-education, 97535- Self Care, 40981- Manual therapy, [phone]- Gait training, [phone]- Orthotic Fit/training, Patient/Family education, Balance training, Stair training, Joint mobilization, Vestibular training, DME instructions, and Wheelchair mobility training  PLAN FOR NEXT SESSION: Stand step transfers with HW, initiate gait  training in // bars > HW w/ +2 follow?  AFO - stand step transfers?  Have we received referrals requested?   Functional strengthening and caregiver ROM HEP.  Lateral stepping at sink to HEP?  Review 2" L step taps.  Mat exercises to work on hip and pelvic mobility - try LTRs, piriformis stretch, SKTC, IR/ER, facilitated bridges   Sadie Haber, PT, DPT 12/20/2022, 5:07 PM

## 2022-12-27 ENCOUNTER — Ambulatory Visit: Payer: Medicare Other | Admitting: Physical Therapy

## 2023-01-02 ENCOUNTER — Telehealth: Payer: Self-pay | Admitting: Physical Therapy

## 2023-01-02 NOTE — Telephone Encounter (Signed)
PT LVM for CMA Hilda at Dr. Barbra Sarks office in regards to ST/OT referrals and order for manual wheelchair.  Will follow-up with patient.  Camille Bal, PT, DPT

## 2023-01-03 ENCOUNTER — Ambulatory Visit: Payer: Medicare Other | Admitting: Physical Therapy

## 2023-01-10 ENCOUNTER — Ambulatory Visit: Payer: Medicare Other | Admitting: Physical Therapy

## 2023-01-24 ENCOUNTER — Ambulatory Visit: Payer: Medicare Other | Attending: Internal Medicine | Admitting: Physical Therapy

## 2023-01-31 ENCOUNTER — Ambulatory Visit: Payer: Medicare Other | Attending: Internal Medicine | Admitting: Physical Therapy

## 2023-01-31 ENCOUNTER — Encounter: Payer: Self-pay | Admitting: Physical Therapy

## 2023-01-31 NOTE — Therapy (Signed)
[facility] [address] [place], [place], [address] Phone: [phone]   Fax:  [phone]  Patient Details  Name: ANNALYSIA JOVE MRN: [id number] Date of Birth: 03-22-1960 Referring Provider:  No ref. provider found  Encounter Date: 01/31/2023  Patient to be d/c from PT services due to exceeding no call/no show and/or more than 3 cancellations in less than 24 hours clinic policy. Patient will need a new referral to return to PT services.  Peter Congo, PT Peter Congo, PT, DPT, CSRS  01/31/2023, 10:12 AM  [facility] [address] [place], [place], [address] Phone: [phone]   Fax:  [phone]

## 2023-02-07 ENCOUNTER — Ambulatory Visit: Payer: Medicare Other | Admitting: Physical Therapy

## 2023-03-20 ENCOUNTER — Telehealth: Payer: Self-pay

## 2023-03-20 NOTE — Telephone Encounter (Signed)
Medication Refill: Pt's daughter Dorene Grebe called to discuss the cost of $ 400 to refill pt's xarelto.  Returned call to pt phone.  Dorene Grebe is not listed on chart.   Spouse returned call within a few minutes.  He thought she had 1 or 2 days left on her current prescription.  Xarelto pamphlet with discount card left at front check in for pick up.

## 2023-03-21 ENCOUNTER — Telehealth: Payer: Self-pay

## 2023-03-21 MED ORDER — APIXABAN 5 MG PO TABS: 5.0000 mg | ORAL_TABLET | Freq: Two times a day (BID) | ORAL | 0 refills | Status: AC

## 2023-03-21 NOTE — Telephone Encounter (Signed)
Medication Refill Update: Coupon card offer not compatible with pt Medicare per conversation with spouse and daughter.   Per conversation with McKenzie, PA transition to Eliquis 5 mg BID until follow up appt.  4 week supply avail for pick up in office.  Spouse and daughter notified to pick up ans start immediatey

## 2023-03-21 NOTE — Telephone Encounter (Signed)
Medication Refill:+ Daughter called inquiring about getting a generic medication d/t cost of xarelto.   Returned call and explained pt husband came by the office and picked up a coupon card yesterday afternoon.

## 2023-03-27 DIAGNOSIS — B379 Candidiasis, unspecified: Secondary | ICD-10-CM | POA: Diagnosis not present

## 2023-03-27 DIAGNOSIS — Z01419 Encounter for gynecological examination (general) (routine) without abnormal findings: Secondary | ICD-10-CM | POA: Diagnosis not present

## 2023-04-19 ENCOUNTER — Ambulatory Visit: Payer: Medicare Other | Admitting: Physical Therapy

## 2023-04-26 ENCOUNTER — Ambulatory Visit: Payer: Medicare Other | Attending: Internal Medicine

## 2023-04-26 DIAGNOSIS — R293 Abnormal posture: Secondary | ICD-10-CM | POA: Insufficient documentation

## 2023-04-26 DIAGNOSIS — R262 Difficulty in walking, not elsewhere classified: Secondary | ICD-10-CM | POA: Insufficient documentation

## 2023-04-26 DIAGNOSIS — M6281 Muscle weakness (generalized): Secondary | ICD-10-CM | POA: Insufficient documentation

## 2023-04-26 DIAGNOSIS — R2689 Other abnormalities of gait and mobility: Secondary | ICD-10-CM | POA: Insufficient documentation

## 2023-04-26 NOTE — Therapy (Signed)
 OUTPATIENT PHYSICAL THERAPY WHEELCHAIR EVALUATION   Patient Name: Andrea Townsend MRN: [id number] DOB:[birth year], 63 y.o., female Today's Date: 04/26/2023  END OF SESSION:  PT End of Session - 04/26/23 0949     Visit Number 1    Number of Visits 1    Authorization Type BCBS    PT Start Time 1010    PT Stop Time 1035    PT Time Calculation (min) 25 min    Equipment Utilized During Treatment Gait belt    Activity Tolerance Patient tolerated treatment well    Behavior During Therapy WFL for tasks assessed/performed             Past Medical History:  Diagnosis Date   Bronchitis    Diabetes mellitus without complication (HCC)    Hyperlipidemia    Hypertension    Stroke [facility])    Past Surgical History:  Procedure Laterality Date   ABDOMINAL SURGERY     CESAREAN SECTION     Patient Active Problem List   Diagnosis Date Noted   Abnormal Doppler ultrasound of carotid artery 04/25/2021   Allergic rhinitis due to pollen 04/25/2021   Aphasia as late effect of cerebrovascular accident 04/25/2021   Essential hypertension 04/25/2021   Generalized anxiety disorder 04/25/2021   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) 04/25/2021   Menopausal symptom 04/25/2021   Obesity 04/25/2021   Peripheral vascular disease (HCC) 04/25/2021   Personal history of transient ischemic attack (TIA), and cerebral infarction without residual deficits 04/25/2021   Ulcer of thigh (HCC) 04/25/2021   Urge incontinence of urine 04/25/2021   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) 04/25/2021   Exacerbation of asthma 09/11/2018   Asthma, chronic 11/10/2014   Allergic rhinitis 11/10/2014   Dyspnea 04/29/2014   Vocal cord dysfunction 04/29/2014   Cough 10/08/2013   Wheezing 10/08/2013   Dysphagia 09/22/2013   Acute respiratory failure (HCC) 09/21/2013   Sinus tachycardia 09/21/2013   Late effects of CVA (cerebrovascular accident)  09/21/2013   Leukocytosis 09/21/2013    PCP: Lorenda Ishihara, MD   REFERRING PROVIDER: Lorenda Ishihara, MD   THERAPY DIAG:  Other abnormalities of gait and mobility - Plan: PT plan of care cert/re-cert  Muscle weakness (generalized) - Plan: PT plan of care cert/re-cert  Abnormal posture - Plan: PT plan of care cert/re-cert  Difficulty in walking, not elsewhere classified - Plan: PT plan of care cert/re-cert  Rationale for Evaluation and Treatment Rehabilitation  SUBJECTIVE:  SUBJECTIVE STATEMENT: Pt presents for wheelchair evaluation. She is currently using a standard manual wheelchair- primarily dependent on her husband/others to maneuver her chair for her.   PRECAUTIONS: Fall and Other: R hemiplegia   WEIGHT BEARING RESTRICTIONS No    OCCUPATION: disabled  PLOF:  Needs assistance with ADLs, Needs assistance with homemaking, Needs assistance with gait, and Needs assistance with transfers  PATIENT GOALS: to get a new wheelchair    MEDICAL HISTORY:  Primary diagnosis onset: ~15 years ago     Medical Diagnosis with ICD-10 code: I49.351 (ICD-10-CM) - Hemiplegia and hemiparesis following cerebral infarction affecting right dominant side    [] Progressive disease  Relevant future surgeries:     Height: 5'4" Weight: 219lbs Explain recent changes or trends in weight:      History:  Past Medical History:  Diagnosis Date   Bronchitis    Diabetes mellitus without complication (HCC)    Hyperlipidemia    Hypertension    Stroke (HCC)        Cardio Status:  Functional Limitations:   [x] Intact  []  Impaired      Respiratory Status:  Functional Limitations:   [x] Intact  [] Impaired   [] SOB [] COPD [] O2 Dependent ______LPM  [] Ventilator Dependent  Resp equip:                                                      Objective Measure(s):   Orthotics:   [] Amputee:                                                             [] Prosthesis:      HOME ENVIRONMENT:  [x] House [] Condo/town home [] Apartment [] Asst living [] LTCF         [x] Own  [] Rent   [] Lives alone [x] Lives with others -       husband                      Hours without assistance: few hours at most  [x] Home is accessible to patient                                 Storage of wheelchair:  [x] In home   [] Other Comments:       COMMUNITY :  TRANSPORTATION:  [x] Car [] Van [] Public Transportation [] Adapted w/c Lift []  Ambulance [] Other:                     [] Sits in wheelchair during transport   Where is w/c stored during transport? trunk [] Tie Downs  []  [employer]  r   [] Self-Driver       Drive while in  Biomedical scientist [] yes [x] no   Employment and/or school:  Specific requirements pertaining to mobility        Other:  COMMUNICATION:  Verbal Communication  [] WFL [x] receptive [] WFL [x] expressive [] Understandable  [] Difficult to understand  [] non-communicative APHASIC Primary Language:__english____________ 2nd:_____________  Communication provided by:[x] Patient [x] Family [] Caregiver [] Translator   [] Uses an augmentative communication device     Manufacturer/Model :    MOBILITY/BALANCE:  Sitting Balance  Standing Balance  Transfers  Ambulation   [] WFL      [] WFL  [] Independent  []  Independent   [x] Uses UE for balance in sitting Comments:  [] Uses UE/device for stability Comments:  [x]  Min assist  []  Ambulates independently with       device:___________________      []  Mod assist  []  Able to ambulate ______ feet        safely/functionally/independently   []  Min assist  [x]  Min assist  []  Max assist  [x]  Non-functional ambulator         History/High risk of falls   []  Mod assist  []  Mod assist  []  Dependent  []  Unable to ambulate   []  Max  assist  []  Max assist  Transfer method:[x] 1 person [] 2 person [] sliding board  [] squat pivot [x] stand pivot [] mechanical patient lift  [] other:   []  Unable  []  Unable    Fall History: # of falls in the past 6 months? Multiple when trying to ambulate alone # of "near" falls in the past 6 months? Multiple when trying to ambulate alone    CURRENT SEATING / MOBILITY:  Current Mobility Device: [] None [] Cane/Walker [x] Manual [] Dependent [] Dependent w/ Tilt rScooter  [] Power (type of control):   Manufacturer: standard Model:  Serial #:   Size:  Color:  Age:   Purchased by whom:   Current condition of mobility base:    Current seating system:                                                                       Age of seating system:    Describe posture in present seating system:    Is the current mobility meeting medical necessity?:  [] Yes [x] No Describe: Patient must extend trunk to reach end of wc so that B LE are in contact with the floor to enable transfers. Wc is too narrow for patients hip width increasing pressure to her B greater trochanters.    Ability to complete Mobility-Related Activities of Daily Living (MRADL's) with Current Mobility Device:   Move room to room  [] Independent  [] Min [] Mod [x] Max assist  [] Unable  Comments:   Meal prep  [] Independent  [] Min [] Mod [x] Max assist  [] Unable    Feeding  [] Independent  [] Min [] Mod [x] Max assist  [] Unable    Bathing  [] Independent  [] Min [] Mod [x] Max assist  [] Unable    Grooming  [] Independent  [] Min [] Mod [x] Max assist  [] Unable    UE dressing  [] Independent  [] Min [] Mod [x] Max assist  [] Unable    LE dressing  [] Independent   [] Min [] Mod [x] Max assist  [] Unable    Toileting  [] Independent  [] Min [] Mod [x] Max assist  [] Unable    Bowel Mgt: [x]  Continent []  Incontinent [x]  Accidents []  Diapers []  Colostomy []  Bowel Program:  Bladder Mgt: [x]  Continent []  Incontinent [x]  Accidents []  Diapers []  Urinal []  Intermittent Cath []  Indwelling Cath []  Supra-pubic Cath     Current Mobility Equipment Trialed/ Ruled Out:     Does not meet mobility needs due to:    Loraine Leriche all boxes that indicate inability to use the specific equipment listed     Meets needs for safe  independent functional  ambulation  / mobility  Risk of  Falling or History of Falls    Enviromental limitations      Cognition    Safety concerns with  physical ability    Decreased / limitations endurance  & strength     Decreased / limitations  motor skills  & coordination    Pain    Pace /  Speed    Cardiac and/or  respiratory condition    Contra - indicated by diagnosis   Cane/Crutches  []   [x]   []   [x]   [x]   [x]   [x]   []   [x]   [x]   []    Walker / Rollator  []  NA   []   [x]   []   [x]   [x]   [x]   [x]   []   [x]   [x]   []     Manual Wheelchair [id number]:  []  NA  [x]   []   []   []   []   []   []   []   []   []   []    Manual W/C (K0005) with power assist  []  NA  []   []   []   []   []   []   []   []   []   []   []    Scooter  []  NA  []   []   []   []   []   []   []   []   []   []   []    Power Wheelchair: standard joystick  []  NA  []   []   []   []   []   []   []   []   []   []   []    Power Wheelchair: alternative controls  []  NA  []   []   []   []   []   []   []   []   []   []   []    Summary:  The least costly alternative for independent functional mobility was found to be:    []  Crutch/Cane  []  Walker [x]  Manual w/c  []  Manual w/c with power assist   []  Scooter   []  Power w/c std joystick   []  Power w/c alternative control        []  Requires dependent care mobility device   Cabin crew for [employer] skills are adequate for safe mobility equipment operation  [x]   Yes []   No  Patient is willing and motivated to use recommended mobility equipment  [x]   Yes []   No       []  Patient is unable to safely operate mobility equipment independently and requires dependent care equipment Comments:           SENSATION and SKIN ISSUES:  Sensation [x]  Intact  []  Impaired []  Absent []  Hyposensate []  Hypersensate  []  Defensiveness  Location(s) of impairment:     Pressure Relief Method(s):  [x]  Lean side to side to offload (without risk of falling)  []   W/C push up (4+ times/hour for 15+ seconds) []  Stand up (without risk of falling)    []  Other: (Describe): Effective pressure relief method(s) above can be performed consistently throughout the day: [x] Yes  []  No If not, Why?:  Skin Integrity Risk:       []  Low risk           [x]  Moderate risk            []  High risk  If high risk, explain:   Skin Issues/Skin Integrity  Current skin Issues  []  Yes [x]  No []  Intact  []   Red area   []   Open area  []  Scar tissue  [x]  At risk from prolonged sitting  Where: sacrum,  coccyx History of Skin Issues  []  Yes [x]  No Where : When: Stage: Hx of skin flap surgeries  []  Yes [x]  No Where:  When:  Pain: []  Yes [x]  No   Pain Location(s):  Intensity scale: (0-10) : How does pain interfere with mobility and/or MRADLs? -     MAT EVALUATION:  Neuro-Muscular Status: (Tone, Reflexive, Responses, etc.)     []   Intact   [x]  Spasticity:  []  Hypotonicity  []  Fluctuating  []  Muscle Spasms  [x]  Poor Righting Reactions/Poor Equilibrium Reactions  []  Primal Reflex(s):    Comments:            COMMENTS:    POSTURE:     Comments:  Pelvis Anterior/Posterior:  []  Neutral   [x]  Posterior  []  Anterior  []  Fixed - No movement [x]  Tendency away from neutral [x]  Flexible [x]  Self-correction [x]  External correction Obliquity (viewed from front)  [x]  WFL []  R Obliquity []  L Obliquity  []  Fixed - No movement []  Tendency away from neutral []  Flexible []  Self-correction []  External correction Rotation  [x]  WFL []  R anterior []  L anterior  []  Fixed - No movement []  Tendency away from neutral []  Flexible []  Self-correction []  External correction Tonal Influence Pelvis:  [x]  Normal []  Flaccid []  Low tone []  Spasticity []  Dystonia []  Pelvis thrust []  Other:    Trunk Anterior/Posterior:  [x]  WFL []  Thoracic kyphosis []  Lumbar  lordosis  []  Fixed - No movement []  Tendency away from neutral []  Flexible []  Self-correction []  External correction  [x]  WFL []  Convex to left  []  Convex to right []  S-curve   []  C-curve []  Multiple curves []  Tendency away from neutral []  Flexible []  Self-correction []  External correction Rotation of shoulders and upper trunk:  [x]  Neutral []  Left-anterior []  Right- anterior []  Fixed- no movement []  Tendency away from neutral []  Flexible []  Self correction []  External correction Tonal influence Trunk:  [x]  Normal []  Flaccid []  Low tone []  Spasticity []  Dystonia []  Other:   Head & Neck  [x]  Functional []  Flexed    []  Extended []  Rotated right  []  Rotated left []  Laterally flexed right []  Laterally flexed left []  Cervical hyperextension   [x]  Good head control []  Adequate head control []  Limited head control []  Absent head control Describe tone/movement of head and neck:    Lower Extremity Measurements: LE ROM:  Passive ROM Right 04/26/2023 Left 04/26/2023  Hip flexion    Hip extension    Hip abduction    Hip adduction    Knee flexion    Knee extension    Ankle dorsiflexion -22*   Ankle plantarflexion     (Blank rows = not tested)  LE MMT:  MMT Right 04/26/2023 Left 04/26/2023  Hip flexion 1- 3  Hip extension    Hip abduction 1- 3  Hip adduction 1- 3  Knee flexion 0 3  Knee extension 2 3  Ankle dorsiflexion 0 3  Ankle plantarflexion 0 3   (Blank rows = not tested)  Hip positions:  []  Neutral   [x]  Abducted   []  Adducted  []  Subluxed   []  Dislocated   []  Fixed   [x]  Tendency away from neutral []  Flexible []  Self-correction []  External correction   Hip Windswept:[]  Neutral  []  Right    [x]  Left  []  Subluxed   []  Dislocated   []  Fixed   [x]  Tendency away from neutral []  Flexible [x]  Self-correction []  External correction  LE Tone: []   Normal []  Low tone [x]  Spasticity []  Flaccid []  Dystonia []  Rocks/Extends at hip [x]  Thrust into  knee extension []  Pushes legs downward into footrest  Foot positioning: ROM Concerns: Dorsiflexed: []  Right   []  Left Plantar flexed: [x]  Right    []  Left Inversion: []  Right    []  Left Eversion: []  Right    []  Left  LE Edema: []  1+ (Barely detectable impression when finger is pressed into skin) []  2+ (slight indentation. 15 seconds to rebound) [x]  3+ (deeper indentation. 30 seconds to rebound) []  4+ (>30 seconds to rebound)  LE Modified [place]  R quads: 3+   UE Measurements:  UPPER EXTREMITY ROM:   Passive ROM Right 04/26/2023 Left 04/26/2023  Shoulder flexion    Shoulder abduction 90*   Shoulder adduction    Elbow flexion    Elbow extension -15*   Wrist flexion    Wrist extension    (Blank rows = not tested)  UPPER EXTREMITY MMT:  MMT Right 04/26/2023 Left 04/26/2023  Shoulder flexion 0   Shoulder abduction 0   Shoulder adduction 0   Elbow flexion 0   Elbow extension 0   Wrist flexion 0   Wrist extension 0   Pinch strength    Grip strength Non functional   (Blank rows = not tested)  Shoulder Posture:  Right Tendency towards Left  []   Functional [x]    []   Elevation []    [x]   Depression []    [x]   Protraction []    []   Retraction []    [x]   Internal rotation []    []   External rotation []    []   Subluxed []     UE Tone: []  Normal []  Flaccid []  Low tone [x]  Spasticity  []  Dystonia []  Other:    Wrist/Hand: Handedness: [x]  Right   []  Left   []  NA: Comments:  Right  Left  []   WNL [x]    []   Limitations []    [x]   Contractures []    [x]   Fisting []    []   Tremors []    []   Weak grasp []    []   Poor dexterity []    [x]   Hand movement non functional []    []   Paralysis []     MOBILITY BASE RECOMMENDATIONS and JUSTIFICATION:  MOBILITY BASE  JUSTIFICATION   Manufacturer:   [employer] Model:                             Catalyst 5 Color: black Seat Width:  18 Seat Depth: 18   [x]  Manual mobility base (continue below)   []  Scooter/POV  []  Power mobility  base   Number of hours per day spent in above selected mobility base: ~8hours  Typical daily mobility base use Schedule: transfers into wheelchair to maneuver through her home and complete MRADLs with assist as needed   [x]  is not a safe, functional ambulator  [x]  limitation prevents from completing a MRADL(s) within a reasonable time frame    [x]  limitation places at high risk of morbidity or mortality secondary to  the attempts to perform a    MRADL(s)  [x]  limitation prevents accomplishing a MRADL(s) entirely  [x]  provide independent mobility  [x]  equipment is a lifetime medical need  [x]  walker or cane inadequate  [x]  any type manual wheelchair      inadequate  [x]  scooter/POV inadequate      []  requires dependent mobility       MANUAL MOBILITY      []   Standard manual wheelchair  K0001      Arm:    []  both []  right  []  left      Foot:   []  both []  right   []  left  []  self-propels wheelchair  []  will use on regular basis  []  chair fits throughout home  []  willing and motivated to use  []  propels with assistance     []  dependent use   []  Standard hemi-manual wheelchair  K0002      Arm:    []  both []  right  []  left      Foot:   []  both []  right   []  left  []  lower seat height required to foot propel  []  short stature  []  self-propels wheelchair  []  will use on regular basis  []  chair fits throughout home  []  willing and motivated to use   []  propels with assistance  []  dependent use   []  Lightweight manual wheelchair  K0003      Arm:    []  both []  right  []  left      Foot:   []  both  []  right  []  left                   []  hemi height required  []  medical condition and weight of  wheelchair affect ability to self      propel standard manual wheelchair in the residence  []  can and does self-propel (marginal propulsion skills)  []  daily use _________hours  []  chair fits throughout home  []  willing and motivated to use  []  lower seat height required to foot propel  []   short stature   []  High strength lightweight manual  wheelchair (Breezy Ultra 4)  K0004     Arm:    []  both []  right  []  left     Foot:   []  both []  right   []  left                                                                  []  hemi height required []  medical condition and weight of wheelchair affect ability to self propel while engaging in frequent MRADL(s) that cannot be performed in a standard or lightweight manual wheelchair  []  daily use _________hours  []  chair fits throughout home  []  willing and motivated to use  []  prevent repetitive use injuries   []  lower seat height required to foot propel  []  short stature    [x]  Ultra-lightweight manual wheelchair  K0005     Arm:    []  both []  right  [x]  left     Foot:   []  both []  right  [x]  left       []  hemi height required  []  heavy duty    Front seat to floor _____ inches      Rear seat to floor _____ inches      Back height _____ inches     Back angle ______ degrees      Front angle _____ degrees  []   full-time manual wheelchair user  [x]  Requires individualized fitting and optimal adjustments for multiple features that include adjustable axle configuration, fully adjustable center of gravity, wheel camber, seat and back angle, angle  of seat slope, which cannot be accommodated by a K0001 through K0004 manual wheelchair  [x]  prevent repetitive use injuries  []  daily use_________hours   []  user has high activity patterns that frequently require  them  to go out into the community for the purpose of independently accomplishing high level MRADL activities. Examples of these might include a combination of; shopping, work, school, Photographer, childcare, independently loading and unloading from a vehicle etc.  []  lower seat height required to foot propel  []  short stature  []  heavy duty -  weight over 250lbs   []  Current chair is a K0005   manufacture:___________________  model:_________________  serial#____________________   age:_________    []  First time [id number] user (complete trial)  K0004 time and # of strokes to propel 30 feet: ________seconds _________strokes  U0454 time and # of strokes to propel 30 feet: ________seconds _________strokes  What was the result of the trial between the K0004 and K0005 manual wheelchair? ___    What features of the K0005 w/c are needed as compared to the K0004 base? Why?_The patient requires a k5 custom manual wheelchair with an adjustable rear axle to improve the overall stability and maneuverability of the wheelchair. This allows for more efficient propulsion of the wheelchair as only her L hemibody is functional. __    [x]  adjustable seat and back angle changes the angle of seat slope of the frame to attain a gravity assisted position for efficient propulsion and proper weight distribution along the frame     [x]  the front of the wheelchair will be configured higher than the back of the chair to allow gravity to assist the user with postural stability  [x]  the center of the wheel will be positioned for stability, safety and efficient propulsion  [x]  adjustable axle allows for vertical, horizontal, camber and overall width changes  throughout the wheels for adjustment of the client's exact needs and abilities.   [x]  adjustable axle increases the stability and function of the chair allowing for adjustment of the center of gravity.   [x]  accommodates the client's anatomical position in the chair maximizing independence in mobility and maneuverability in all environments.   [x]  create a minimal fixed tilt-in space to assist in positioning.   []  Describe users full-time manual wheelchair activity patterns:___    []  Power assist Comments:  []  prevent repetitive use injuries  []  repetitive strain injury present in    shoulder girdle    []  shoulder pain is (> or =) to 7/10     during manual propulsion       Current Pain _____/10  []  requires conservation of energy to participate  in MRADL(s) runable to propel up ramps or curbs using manual wheelchair  []  been K0005 user greater than one year  []  user unwilling to use power      wheelchair (reason): []  less expensive option to power   wheelchair   []  rim activated power assist -      decreased strength   []  Heavy duty manual wheelchair       K0006     Arm:    []  both []  right  []  left     Foot:   []  both []  right  []  left     []  hemi height required    []  Dependent base  []  user exceeds 250lbs  []  non-functional ambulator    []  extreme spasticity  []  over active movement   []  broken frame/hx of repeated  repairs  []  able to self-propel in residence       []  lower seat to floor height required  []  unable to self-propel in residence   []  Extra heavy duty manual wheelchair  K0007     Arm:    []  both []  right  []  left     Foot:   []  both []  right  []  left     []  hemi height required  []  Dependent base  []  user exceeds 300lbs  []  non-functional ambulator    []  able to self-propel in residence   []  lower seat to floor height required  []  unable to self-propel in residence     []  Manual wheelchair with tilt [phone]      (Manual "Tilt-n-Space")  []  patient is dependent for transfers  []  patient requires frequent       positioning for pressure relief   []  patient requires frequent      positioning for poor/absent trunk control        []  Stroller Base  []  infant/child   []  unable to propel manual      wheelchair  []  allows for growth  []  non-functional ambulator  []  non-functional UE  []  independent mobility is not a goal at this time    MANUAL FRAME OPTIONS      Push handles  []  extended   []  angle adjustable   []  standard  []  caregiver access  []  caregiver assist    []  allows "hooking" to enable      increased ability to perform ADLs or maintain balance   []  Angle Adjustable Back  []  postural control  []  control of tone/spasticity  []  accommodation of range of motion  []  UE functional control  []   accommodation for seating system    Rear wheel placement  []  std/fixed  [] fully adjustableramputee   []  camber ________degree  []  removable rear wheel  []  non-removable rear wheel  Wheel size _______  Wheel style_______________________  []  improved UE access to wheels  []  increase propulsion ability  []  improved stability  []  changing angle in space for      improvement of postural stability  []  remove for transport    []  allow for seating system to fit on  base  []  amputee placement  []  1-arm drive access   r R  r L  []  enable propulsion of manual       wheelchair with one arm    []  amputee placement   Wheel rims/ Hand rims  []  Standard    []  Specialized-____ []  provide ability to propel manual   []  increase self-propulsion with hand wheelchair weakness/decreased grasp     []  Spoke protector/guard   []  prevent hands from getting caught in spokes   Tires:  [x]  pneumatic  [x]  flat free inserts  []  solid  Style:  [x]  decrease roll resistance              [x]  prevent frequent flats  [x]  increase shock absorbency  [x]  decrease maintenance   []  decrease pain from road shock    []  decrease spasms from road shock    Wheel Locks:    [x]  push []  pull []  scissor  [x]  lock wheels for transfers  [x]  lock wheels from rolling   Brake/wheel lock extension:  [x]  R  [x]  L  [x]  allow user to operate wheel locks due to decreased reach or strength   Caster housing:  Caster size:  Style:                                          []  suspension fork  []  maneuverability   []  stability of wheelchair   []  durability  []  maintenance  []  angle adjustment for posture  []  allow for feet to come under        wheelchair base  []  allows change in seat to floor  height   []  increase shock absorbency  []  decrease pain from road shock  []  decrease spasms from road    shock   []  Side guards  []  prevent clothing getting caught in wheel or becoming soiled   [] provide hip and pelvic  stability  []  eliminates contact between body and wheels  []  limit hand contact with wheels   [x]  Anti-tippers      [x]  prevent wheelchair from tipping    backward  [x]  assist caregiver with curbs    CHAIR OPTIONS MANUAL & POWER      Armrests   [x]  adjustable height [x]  removable  []  swing away []  fixed  [x]  flip back  []  reclining  []  full length pads [x]  desk []  tube arms []  gel pads  [x]  provide support with elbow at 90    [x]  remove/flip back/swing away for  transfers  [x]  provide support and positioning of upper body    [x]  allow to come closer to table top  [x]  remove for access to tables  [x]  provide support for w/c tray  [x]  change of height/angles for variable activities   []  Elbow support / Elbow stop  []  keep elbow positioned on arm pad  []  keep arms from falling off arm pad  during tilt and/or recline   Upper Extremity Support  []  Arm trough  []   R  []   L  Style:  []  swivel mount []  fixed mount   []  posterior hand support  []   tray  []  full tray  []  joystick cut out  []   R  []   L  Style:  []  decrease gravitational pull on      shoulders  []  provide support to increase UE  function  []  provide hand support in natural    position  []  position flaccid UE  []  decrease subluxation    []  decrease edema       []  manage spasticity   []  provide midline positioning  []  provide work surface  []  placement for AAC/ Computer/ EADL     Hangers/ Legrests   []  ______ degree  []  Elevating []  articulating  []  swing away []  fixed []  lift off  []  heavy duty  []  adjustable knee angle  []  adjustable calf panel   []  longer extension tube              []  provide LE support  []  maintain placement of feet on      footplate   []  accommodate lower leg length  []  accommodate to hamstring       tightness  []  enable transfers  []  provide change in position for LE's  []  elevate legs during recline    []  decrease edema  []  durability      Foot support   []  footplate []  R []  L []  flip  up           []  Depth adjustable   []   angle adjustable  []  foot board/one piece    []  provide foot support  []  accommodate to ankle ROM  []  allow foot to go under wheelchair base  []  enable transfers     []  Shoe holders  []  position foot    []  decrease / manage spasticity  []  control position of LE  []  stability    []  safety     []  Ankle strap/heel      loops  []  support foot on foot support  []  decrease extraneous movement  []  provide input to heel   []  protect foot     []  Amputee adapter []  R  []  L     Style:                  Size:  []  Provide support for stump/residual extremity    []  Transportation tie-down  []  to provide crash tested tie-down brackets    []  Crutch/cane holder    []  O2 holder    []  IV hanger   []  Ventilator tray/mount    []  stabilize accessory on wheelchair       Component  Justification     [x]  Seat cushion     Axiam S []  accommodate impaired sensation  []  decubitus ulcers present or history  [x]  unable to shift weight  [x]  increase pressure distribution  []  prevent pelvic extension  []  custom required "off-the-shelf"    seat cushion will not accommodate deformity  []  stabilize/promote pelvis alignment  []  stabilize/promote femur alignment  []  accommodate obliquity  []  accommodate multiple deformity  []  incontinent/accidents  [x]  low maintenance     []  seat mounts                 []  fixed []  removable  []  attach seat platform/cushion to wheelchair frame    []  Seat wedge    []  provide increased aggressiveness of seat shape to decrease sliding  down in the seat  []  accommodate ROM        []  Cover replacement   []  protect back or seat cushion  []  incontinent/accidents    []  Solid seat / insert    []  support cushion to prevent      hammocking  []  allows attachment of cushion to mobility base    []  Lateral pelvic/thigh/hip     support (Guides)     []  decrease abduction  []  accommodate pelvis  []  position upper legs  []  accommodate spasticity  []   removable for transfers     []  Lateral pelvic/thigh      supports mounts  []  fixed   []  swing-away   []  removable  []  mounts lateral pelvic/thigh supports     []  mounts lateral pelvic/thigh supports swing-away or removable for transfers    []  Medial thigh support ([place]mel)  [] decrease adduction  [] accommodate ROM  []  remove for transfers   []  alignment      []  Medial thigh   []  fixed      support mounts      []  swing-away   []  removable  []  mounts medial thigh supports   []  Mounts medial supports swing- away or removable for transfers     Component  Justification   []  Back       []  provide posterior trunk support []  facilitate tone  []  provide lumbar/sacral support []  accommodate deformity  []  support trunk in midline   []  custom required "off-the-shelf" back support will not accommodate deformity   []   provide lateral trunk support []  accommodate or decrease tone            []  Back mounts  []  fixed  []  removable  []  attach back rest/cushion to wheelchair frame   []  Lateral trunk      supports  []  R []  L  []  decrease lateral trunk leaning  []  accommodate asymmetry    []  contour for increased contact  []  safety    []  control of tone    []  Lateral trunk      supports mounts  []  fixed  []  swing-away   []  removable  []  mounts lateral trunk supports     []  Mounts lateral trunk supports swing-away or removable for transfers   []  Anterior chest      strap, vest     []  decrease forward movement of shoulder  []  decrease forward movement of trunk  []  safety/stability  []  added abdominal support  []  trunk alignment  []  assistance with shoulder control   []  decrease shoulder elevation    []  Headrest      []  provide posterior head support  []  provide posterior neck support  []  provide lateral head support  []  provide anterior head support  []  support during tilt and recline  []  improve feeding     []  improve respiration  []  placement of switches  []  safety    []  accommodate ROM   []   accommodate tone  []  improve visual orientation   []  Headrest           []  fixed []  removable []  flip down      Mounting hardware   []  swing-away laterals/switches  []  mount headrest   []  mounts headrest flip down or  removable for transfers  []  mount headrest swing-away laterals   []  mount switches     []  Neck Support    []  decrease neck rotation  []  decrease forward neck flexion   Pelvic Positioner    []  std hip belt          []  padded hip belt  []  dual pull hip belt  []  four point hip belt  []  stabilize tone  []  decrease falling out of chair  []  prevent excessive extension  []  special pull angle to control      rotation  []  pad for protection over boney   prominence  []  promote comfort    []  Essential needs        bag/pouch   []  medicines []  special food rorthotics []  clothing changes  []  diapers  []  catheter/hygiene []  ostomy supplies   The above equipment has a life- long use expectancy.  Growth and changes in medical and/or functional conditions would be the exceptions.   SUMMARY:  Why mobility device was selected; include why a lower level device is not appropriate:    ASSESSMENT:  CLINICAL IMPRESSION: Patient is a 63 y.o. female who was seen today for physical therapy for a custom manual wheelchair. 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 Her R upper extremity is flaccid with 3+ -4+ tone in her wrist and fingers, rendering it non-functional. Her R quadriceps also have 3+ spasticity and she presents with extensor tone in that R lower extremity as well. She has minimal to no volitional movement in her right lower extremity and so is a non-functional ambulator. Within her home, she will ambulate ~38ft using a tub transfer bench as support. She  has sustained multiple falls while trying to ambulate by herself and so truly requires a K5 wheelchair in order to maintain safety and independence with maneuvering around her house.  She requires MinA to stand from her wheelchair and CGA/MinA to complete a stand step pivot to a different sitting surface. She is rarely alone within her home and so has assist when needed to transfer in and out of her wheelchair safely.  Her K5 wheelchair will need pneumatic tires with flat free inserts to reduce the roll resistance, decrease maintenance and improve the durability. As she will be using her wheelchair everyday to safely and independently complete her MRADLs, her tires must be able to withstand that repetitive use without failure. Push to lock brakes with brakes extenders bilaterally will allow the patient to lock her rear wheels using her functional L upper extremity for safe transfers in and out of the wheelchair. Anti tippers will prevent the wheelchair from tipped posteriorly having the patient fall out of the wheelchair. The anti tippers will also assist her family with navigating curbs and other obstacles in the community.  Adjustable height, flip back, desk length arm rests are necessary for the patients wheelchair. They must be adjustable height to provide adequate upper extremity support while she uses the wheelchair. This is especially true to protect her R shoulder from subluxation as she lacks strength in that arm to protect her shoulder joint. Flip back arm rests will enable safe transfers in and out of the wheelchair should she complete squat pivot or slideboard transfers at some point. Desk length arm rests will allow the patient to access various work surfaces so that she can independently complete her MRADLs while safely seated in her wheelchair.  An Axiam S cushion will provide adequate pressure distribution to prevent development of pressure injuries. She is at an increased risk for developing pressure injuries on her sacrum and coccyx due to being seated for a majority of the day. She is a non functional ambulator and requires at least MinA to safely stand and so cannot  complete a full pressure relief without assist consistently throughout the day.  The patients need for a custom manual wheelchair is a lifelong medical need. She sustained a catastrophic stroke ~15 years ago and, since then, has not been a functional ambulator. Given her R hemiparesis and significant spasticity in her R hemibody, she requires a custom manual wheelchair to safely, efficiently and independently complete any and all MRADLs.   OBJECTIVE IMPAIRMENTS Abnormal gait, cardiopulmonary status limiting activity, decreased balance, decreased coordination, decreased endurance, decreased knowledge of condition, decreased knowledge of use of DME, decreased mobility, difficulty walking, decreased ROM, decreased strength, decreased safety awareness, increased edema, impaired tone, impaired UE functional use, and postural dysfunction.   ACTIVITY LIMITATIONS carrying, lifting, standing, squatting, stairs, transfers, locomotion level, and caring for others  PARTICIPATION LIMITATIONS: meal prep, cleaning, interpersonal relationship, driving, shopping, community activity, and occupation  PERSONAL FACTORS Age, Behavior pattern, Past/current experiences, Social background, Time since onset of injury/illness/exacerbation, and Transportation are also affecting patient's functional outcome.   REHAB POTENTIAL: Good  CLINICAL DECISION MAKING: Stable/uncomplicated  EVALUATION COMPLEXITY: High                                   GOALS: One time visit. No goals established.    PLAN: PT FREQUENCY: one time visit    Westley Foots, PT Westley Foots, PT, DPT,  CBIS  04/26/2023, 11:44 AM    I concur with the above findings and recommendations of the therapist:  Physician name printed:         Physician's signature:      Date:

## 2023-05-01 ENCOUNTER — Ambulatory Visit (HOSPITAL_COMMUNITY)
Admission: RE | Admit: 2023-05-01 | Discharge: 2023-05-01 | Disposition: A | Discharge status: Home / Self Care | Payer: Medicare Other | Source: Ambulatory Visit | Attending: Vascular Surgery | Admitting: Vascular Surgery

## 2023-05-01 ENCOUNTER — Ambulatory Visit: Payer: Medicare Other | Admitting: Physician Assistant

## 2023-05-01 ENCOUNTER — Encounter: Payer: Self-pay | Admitting: Physician Assistant

## 2023-05-01 VITALS — BP 128/75 | HR 99 | Temp 97.9°F | Ht 64.0 in

## 2023-05-01 DIAGNOSIS — I82441 Acute embolism and thrombosis of right tibial vein: Secondary | ICD-10-CM | POA: Diagnosis not present

## 2023-05-01 DIAGNOSIS — M7989 Other specified soft tissue disorders: Secondary | ICD-10-CM

## 2023-05-01 NOTE — Progress Notes (Signed)
 [facility]           OF [place]  History and Physical   Andrea Townsend is a 63 y.o. female who presents for follow up for DVT RLE.  She was seen in August 2024 for 2 week hx of leg swelling.  Pt has hx of stroke with expressive aphasia.  She was found to have DVT of indeterminate age in the PT and gastrocnemius vein and was placed on Xarelto.    She has hx of PAD with a known right EIA occlusion.  She had ABI in August 2024 with 0.65 on the right and 1.07 on the left.  Toe pressure on the right was 73 and 119 on the left.    She was last seen on 11/07/2022.  She continued to have evidence of DVT.  She was encouraged to elevate her legs throughout the day and continue Xarelto for another six months.   She comes in today for evaluation and here with her husband of over 30 years and he provides most hx due to pt having some aphasia.  Her daughter is also present by telephone.  Husband states that the pharmacy was out of Xarelto when they needed it last and therefore was placed on eliquis.  He states that since then, her leg swelling has improved.  Pt denies any wounds on her feet or pain in her feet at night.  Her swelling is better.  Her husband states that the compression is really tight and makes her legs hurt.  She does take a daily asa.     The pt is on a statin for cholesterol management.  The pt is on a daily aspirin.   Other AC:  Eliquis The pt is on ACEI for hypertension.   The pt is not on medication for diabetes.   Tobacco hx:  former   Past Medical History:  Diagnosis Date   Bronchitis    Diabetes mellitus without complication (HCC)    Hyperlipidemia    Hypertension    Stroke [facility])     Past Surgical History:  Procedure Laterality Date   ABDOMINAL SURGERY     CESAREAN SECTION      Social History   Socioeconomic History   Marital status: Married    Spouse name: Not on file   Number of children: Not on file   Years of education:  Not on file   Highest education level: Not on file  Occupational History   Not on file  Tobacco Use   Smoking status: Former    Current packs/day: 0.00    Average packs/day: 1 pack/day for 20.0 years (20.0 ttl pk-yrs)    Types: Cigarettes    Start date: 09/22/1986    Quit date: 09/22/2006    Years since quitting: 16.6   Smokeless tobacco: Never  Vaping Use   Vaping status: Never Used  Substance and Sexual Activity   Alcohol use: No   Drug use: No   Sexual activity: Not on file  Other Topics Concern   Not on file  Social History Narrative   Not on file   Social Drivers of Health   Financial Resource Strain: Not on file  Food Insecurity: Low Risk  (11/13/2022)   Received from [facility]   Hunger Vital Sign    Worried About Running Out of Food in the Last Year: Never true    Ran Out of Food in the Last  Year: Never true  Transportation Needs: No Transportation Needs (11/13/2022)   Received from [facility]    In the past 12 months, has lack of reliable transportation kept you from medical appointments, meetings, work or from getting things needed for daily living? : No  Physical Activity: Not on file  Stress: Not on file  Social Connections: Unknown (07/04/2021)   Received from [facility]   Social Network    Social Network: Not on file  Intimate Partner Violence: Unknown (06/02/2021)   Received from [facility]   HITS    Physically Hurt: Not on file    Insult or Talk Down To: Not on file    Threaten Physical Harm: Not on file    Scream or Curse: Not on file     Family History  Problem Relation Age of Onset   Asthma Mother        hospitalization with exacerbation   Asthma Sister        hospitalization with exacerbation    Current Outpatient Medications  Medication Sig Dispense Refill   albuterol (PROVENTIL) (2.5 MG/3ML) 0.083% nebulizer solution Take 3 mLs (2.5 mg total) by nebulization every 6 (six) hours. DX:  J45.909 360 mL 11   albuterol (VENTOLIN HFA) 108 (90 Base) MCG/ACT inhaler Inhale 2 puffs into the lungs every 4 (four) hours as needed for wheezing or shortness of breath. 7 g 3   apixaban (ELIQUIS) 5 MG TABS tablet Take 1 tablet (5 mg total) by mouth 2 (two) times daily. 60 tablet 0   atorvastatin (LIPITOR) 20 MG tablet 1 tablet     Biotin 1000 MCG tablet Take 1,000 mcg by mouth 3 (three) times daily.     Budeson-Glycopyrrol-Formoterol (BREZTRI AEROSPHERE) 160-9-4.8 MCG/ACT AERO Inhale 2 puffs into the lungs in the morning and at bedtime. 10.7 g 28   lisinopril (ZESTRIL) 10 MG tablet Take 1 tablet by mouth daily.     loratadine (CLARITIN) 10 MG tablet Take 10 mg by mouth daily.     metFORMIN (GLUCOPHAGE) 500 MG tablet 1 tablet with a meal     Multiple Vitamin (MULTIVITAMIN WITH MINERALS) TABS tablet Take 1 tablet by mouth daily.     omega-3 acid ethyl esters (LOVAZA) 1 G capsule Take 1 capsule by mouth 2 (two) times daily.     Spacer/Aero-Holding Chambers (AEROCHAMBER MV) inhaler Use as instructed 1 each 0   No current facility-administered medications for this visit.    No Known Allergies  REVIEW OF SYSTEMS:   [X]  denotes positive finding, [ ]  denotes negative finding Cardiac  Comments:  Chest pain or chest pressure:    Shortness of breath upon exertion:    Short of breath when lying flat:    Irregular heart rhythm:        Vascular    Pain in calf, thigh, or hip brought on by ambulation:    Pain in feet at night that wakes you up from your sleep:     Blood clot in your veins: x   Leg swelling:  x       Pulmonary    Oxygen at home:    Productive cough:     Wheezing:         Neurologic    Sudden weakness in arms or legs:     Sudden numbness in arms or legs:     Sudden onset of difficulty speaking or slurred speech:    Temporary loss of  vision in one eye:     Problems with dizziness:         Gastrointestinal    Blood in stool:     Vomited blood:         Genitourinary     Burning when urinating:     Blood in urine:        Psychiatric    Major depression:         Hematologic    Bleeding problems:    Problems with blood clotting too easily:        Skin    Rashes or ulcers:        Constitutional    Fever or chills:      PHYSICAL EXAMINATION:  Today's Vitals   05/01/23 1453 05/01/23 1454  BP:  128/75  Pulse:  99  Temp:  97.9 F (36.6 C)  TempSrc:  Temporal  SpO2:  95%  Height:  5\' 4"  (1.626 m)  PainSc: 4     Body mass index is 37.59 kg/m.   General:  WDWN in NAD; vital signs documented above Gait: Not observed HENT: WNL, normocephalic Pulmonary: normal non-labored breathing without wheezing Cardiac: regular HR; without carotid bruits Abdomen: obese Skin: without rashes Vascular Exam/Pulses:  Right Left  Radial 2+ (normal) 2+ (normal)   Extremities: + RLE swelling  Neurologic: A&O X 3;  moving all extremities equally; mostly aphasic Psychiatric:  The pt has Normal affect.   Non-Invasive Vascular Imaging:   Venous duplex on 05/01/2023: +---------+---------------+---------+-----------+----------+--------------+   RIGHT   CompressibilityPhasicitySpontaneityPropertiesThrombus  Aging  +---------+---------------+---------+-----------+----------+--------------+  CFV     Full           Yes      Yes                                 +---------+---------------+---------+-----------+----------+--------------+  SFJ     Full                    Yes                                 +---------+---------------+---------+-----------+----------+--------------+  FV Prox  Full           Yes      Yes                                 +---------+---------------+---------+-----------+----------+--------------+  FV Mid   Full           Yes      Yes                                 +---------+---------------+---------+-----------+----------+--------------+  FV DistalFull           Yes      Yes                                  +---------+---------------+---------+-----------+----------+--------------+  POP     Full           Yes      Yes                                 +---------+---------------+---------+-----------+----------+--------------+  PTV     Full                    Yes                                 +---------+---------------+---------+-----------+----------+--------------+  PERO    Full                    Yes                                 +---------+---------------+---------+-----------+----------+--------------+  Gastroc Full                                                        +---------+---------------+---------+-----------+----------+--------------+  GSV     Full           Yes      Yes                                 +---------+---------------+---------+-----------+----------+--------------+  +----+---------------+---------+-----------+----------+--------------+  LEFTCompressibilityPhasicitySpontaneityPropertiesThrombus Aging  +----+---------------+---------+-----------+----------+--------------+  CFV Full           Yes      Yes                                  +----+---------------+---------+-----------+----------+--------------+  SFJ Full                    Yes                                  +----+---------------+---------+-----------+----------+--------------+   Summary:  RIGHT:  - There is no evidence of deep vein thrombosis in the lower extremity.  - There is no evidence of superficial venous thrombosis.  LEFT:  - No evidence of common femoral vein obstruction.     Andrea Townsend is a 63 y.o. female who presents with hx of DVT RLE.  She was seen in August 2024 for 2 week hx of leg swelling.  Pt has hx of stroke with expressive aphasia.  She was found to have DVT of indeterminate age in the PT and gastrocnemius vein and was placed on Xarelto.    She has hx of PAD with a known right EIA occlusion.  She had ABI in August  2024 with 0.65 on the right and 1.07 on the left.  Toe pressure on the right was 73 and 119 on the left.      -duplex today reveals no evidence of residual DVT.  Given this is her 1st DVT, we will stop the Eliquis since it has been 6 months.  She will continue her baby asa daily. -will refer her for hypercoagulable workup -strongly advised pt to wear her compression and elevate her legs 2-3 times per day to help with the swelling.   -she will f/u in 6 months with ABI.  She does not have any rest pain or non healing wounds.  They will call sooner if there are any issues before then.  Doreatha Massed, [facility] [phone]  Clinic MD:  Randie Heinz

## 2023-05-03 ENCOUNTER — Other Ambulatory Visit: Payer: Self-pay | Admitting: *Deleted

## 2023-05-03 DIAGNOSIS — I739 Peripheral vascular disease, unspecified: Secondary | ICD-10-CM

## 2023-05-03 DIAGNOSIS — I745 Embolism and thrombosis of iliac artery: Secondary | ICD-10-CM

## 2023-05-14 DIAGNOSIS — I10 Essential (primary) hypertension: Secondary | ICD-10-CM | POA: Diagnosis not present

## 2023-05-14 DIAGNOSIS — Z86718 Personal history of other venous thrombosis and embolism: Secondary | ICD-10-CM | POA: Diagnosis not present

## 2023-05-14 DIAGNOSIS — I69320 Aphasia following cerebral infarction: Secondary | ICD-10-CM | POA: Diagnosis not present

## 2023-05-14 DIAGNOSIS — I69351 Hemiplegia and hemiparesis following cerebral infarction affecting right dominant side: Secondary | ICD-10-CM | POA: Diagnosis not present

## 2023-05-23 ENCOUNTER — Other Ambulatory Visit: Payer: Self-pay | Admitting: Internal Medicine

## 2023-05-23 DIAGNOSIS — Z1231 Encounter for screening mammogram for malignant neoplasm of breast: Secondary | ICD-10-CM

## 2023-05-30 ENCOUNTER — Ambulatory Visit
Admission: RE | Admit: 2023-05-30 | Discharge: 2023-05-30 | Disposition: A | Discharge status: Home / Self Care | Source: Ambulatory Visit | Attending: Internal Medicine | Admitting: Internal Medicine

## 2023-05-30 DIAGNOSIS — Z1231 Encounter for screening mammogram for malignant neoplasm of breast: Secondary | ICD-10-CM

## 2023-07-04 DIAGNOSIS — J069 Acute upper respiratory infection, unspecified: Secondary | ICD-10-CM | POA: Diagnosis not present

## 2023-07-04 DIAGNOSIS — J4541 Moderate persistent asthma with (acute) exacerbation: Secondary | ICD-10-CM | POA: Diagnosis not present

## 2023-07-23 ENCOUNTER — Other Ambulatory Visit: Payer: Self-pay

## 2023-07-23 DIAGNOSIS — J4540 Moderate persistent asthma, uncomplicated: Secondary | ICD-10-CM

## 2023-07-23 MED ORDER — BREZTRI AEROSPHERE 160-9-4.8 MCG/ACT IN AERO: 2.0000 | INHALATION_SPRAY | Freq: Two times a day (BID) | RESPIRATORY_TRACT | 28 refills | Status: DC

## 2023-07-25 ENCOUNTER — Other Ambulatory Visit: Payer: Self-pay

## 2023-07-25 DIAGNOSIS — J4540 Moderate persistent asthma, uncomplicated: Secondary | ICD-10-CM

## 2023-07-25 MED ORDER — BREZTRI AEROSPHERE 160-9-4.8 MCG/ACT IN AERO: 2.0000 | INHALATION_SPRAY | Freq: Two times a day (BID) | RESPIRATORY_TRACT | 3 refills | Status: DC

## 2023-07-29 DIAGNOSIS — B078 Other viral warts: Secondary | ICD-10-CM | POA: Diagnosis not present

## 2023-07-29 DIAGNOSIS — L820 Inflamed seborrheic keratosis: Secondary | ICD-10-CM | POA: Diagnosis not present

## 2023-09-12 ENCOUNTER — Telehealth: Payer: Self-pay

## 2023-09-12 NOTE — Telephone Encounter (Signed)
 Pt's husband called to report pt's increased swelling and discomfort in her legs; patient is non-verbal.  Pt's husband knows to elevate pt's leg above her heart and have her wear compression stockings.

## 2023-09-24 ENCOUNTER — Other Ambulatory Visit: Payer: Self-pay | Admitting: Physician Assistant

## 2023-09-24 ENCOUNTER — Ambulatory Visit (HOSPITAL_BASED_OUTPATIENT_CLINIC_OR_DEPARTMENT_OTHER)
Admission: RE | Admit: 2023-09-24 | Discharge: 2023-09-24 | Disposition: A | Discharge status: Home / Self Care | Source: Ambulatory Visit | Attending: Physician Assistant | Admitting: Physician Assistant

## 2023-09-24 ENCOUNTER — Ambulatory Visit: Attending: Physician Assistant | Admitting: Physician Assistant

## 2023-09-24 ENCOUNTER — Ambulatory Visit (HOSPITAL_COMMUNITY)
Admission: RE | Admit: 2023-09-24 | Discharge: 2023-09-24 | Disposition: A | Discharge status: Home / Self Care | Source: Ambulatory Visit | Attending: Physician Assistant | Admitting: Physician Assistant

## 2023-09-24 VITALS — BP 133/84 | HR 87 | Temp 97.7°F

## 2023-09-24 DIAGNOSIS — I87001 Postthrombotic syndrome without complications of right lower extremity: Secondary | ICD-10-CM | POA: Diagnosis not present

## 2023-09-24 DIAGNOSIS — M7989 Other specified soft tissue disorders: Secondary | ICD-10-CM | POA: Diagnosis not present

## 2023-09-24 DIAGNOSIS — Z86718 Personal history of other venous thrombosis and embolism: Secondary | ICD-10-CM | POA: Diagnosis not present

## 2023-09-24 DIAGNOSIS — I745 Embolism and thrombosis of iliac artery: Secondary | ICD-10-CM | POA: Insufficient documentation

## 2023-09-24 DIAGNOSIS — I739 Peripheral vascular disease, unspecified: Secondary | ICD-10-CM | POA: Diagnosis not present

## 2023-09-24 LAB — VAS US ABI WITH/WO TBI
Left ABI: 1.1
Right ABI: 0.67

## 2023-09-24 NOTE — Progress Notes (Signed)
 Office Note   History of Present Illness   Andrea Townsend is a 63 y.o. (1960-05-17) female who presents as a triage visit.  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  She was placed on anticoagulation for 6 months, and her repeat ultrasound in March this year demonstrated no residual DVT.  She was then transitioned from Eliquis  to a daily baby aspirin, given that she has a one-time history of DVT.  She returns today as a triage visit.  She says overall that her right lower extremity swelling was improving after her DVT for the past several months.  About a month ago she had a sudden increase in her right leg swelling again.  This has caused her some mild discomfort.  She says the swelling is from her knee to her foot.  She says that she is still wearing her compression stockings.  She endorses not elevating her legs that much.  She does have a history of stroke and is wheelchair dependent.  Past Medical History:  Diagnosis Date   Bronchitis    Diabetes mellitus without complication (HCC)    Hyperlipidemia    Hypertension    Stroke [facility])     Past Surgical History:  Procedure Laterality Date   ABDOMINAL SURGERY     CESAREAN SECTION      Social History   Socioeconomic History   Marital status: Married    Spouse name: Not on file   Number of children: Not on file   Years of education: Not on file   Highest education level: Not on file  Occupational History   Not on file  Tobacco Use   Smoking status: Former    Current packs/day: 0.00    Average packs/day: 1 pack/day for 20.0 years (20.0 ttl pk-yrs)    Types: Cigarettes    Start date: 09/22/1986    Quit date: 09/22/2006    Years since quitting: 17.0   Smokeless tobacco: Never  Vaping Use   Vaping status: Never Used  Substance and Sexual Activity   Alcohol use: No   Drug use: No   Sexual activity: Not on file  Other Topics  Concern   Not on file  Social History Narrative   Not on file   Social Drivers of Health   Financial Resource Strain: Not on file  Food Insecurity: Low Risk  (11/13/2022)   Received from [facility]   Hunger Vital Sign    Within the past 12 months, you worried that your food would run out before you got money to buy more: Never true    Within the past 12 months, the food you bought just didn't last and you didn't have money to get more. : Never true  Transportation Needs: No Transportation Needs (11/13/2022)   Received from [facility]    In the past 12 months, has lack of reliable transportation kept you from medical appointments, meetings, work or from getting things needed for daily living? : No  Physical Activity: Not on file  Stress: Not on file  Social Connections: Unknown (07/04/2021)   Received from [facility]   Social Network    Social Network: Not on file  Intimate Partner Violence: Unknown (06/02/2021)   Received from [facility]   HITS    Physically Hurt: Not on file    Insult or Talk Down  To: Not on file    Threaten Physical Harm: Not on file    Scream or Curse: Not on file    Family History  Problem Relation Age of Onset   Asthma Mother        hospitalization with exacerbation   Asthma Sister        hospitalization with exacerbation    Current Outpatient Medications  Medication Sig Dispense Refill   albuterol  (PROVENTIL ) (2.5 MG/3ML) 0.083% nebulizer solution Take 3 mLs (2.5 mg total) by nebulization every 6 (six) hours. DX: J45.909 360 mL 11   albuterol  (VENTOLIN  HFA) 108 (90 Base) MCG/ACT inhaler Inhale 2 puffs into the lungs every 4 (four) hours as needed for wheezing or shortness of breath. 7 g 3   apixaban  (ELIQUIS ) 5 MG TABS tablet Take 1 tablet (5 mg total) by mouth 2 (two) times daily. 60 tablet 0   atorvastatin (LIPITOR) 20 MG tablet 1 tablet     Biotin 1000 MCG tablet Take 1,000 mcg by mouth 3 (three) times daily.      budesonide -glycopyrrolate-formoterol  (BREZTRI  AEROSPHERE) 160-9-4.8 MCG/ACT AERO inhaler Inhale 2 puffs into the lungs in the morning and at bedtime. 10.7 g 3   buPROPion (WELLBUTRIN XL) 150 MG 24 hr tablet Take 150 mg by mouth every morning.     lisinopril (ZESTRIL) 10 MG tablet Take 1 tablet by mouth daily.     loratadine  (CLARITIN ) 10 MG tablet Take 10 mg by mouth daily.     metFORMIN (GLUCOPHAGE) 500 MG tablet 1 tablet with a meal     Multiple Vitamin (MULTIVITAMIN WITH MINERALS) TABS tablet Take 1 tablet by mouth daily.     omega-3 acid ethyl esters (LOVAZA ) 1 G capsule Take 1 capsule by mouth 2 (two) times daily.     Spacer/Aero-Holding Chambers (AEROCHAMBER MV) inhaler Use as instructed 1 each 0   No current facility-administered medications for this visit.    No Known Allergies  REVIEW OF SYSTEMS (negative unless checked):   Cardiac:  []  Chest pain or chest pressure? []  Shortness of breath upon activity? []  Shortness of breath when lying flat? []  Irregular heart rhythm?  Vascular:  []  Pain in calf, thigh, or hip brought on by walking? []  Pain in feet at night that wakes you up from your sleep? []  Blood clot in your veins? [x]  Leg swelling?  Pulmonary:  []  Oxygen at home? []  Productive cough? []  Wheezing?  Neurologic:  []  Sudden weakness in arms or legs? []  Sudden numbness in arms or legs? []  Sudden onset of difficult speaking or slurred speech? []  Temporary loss of vision in one eye? []  Problems with dizziness?  Gastrointestinal:  []  Blood in stool? []  Vomited blood?  Genitourinary:  []  Burning when urinating? []  Blood in urine?  Psychiatric:  []  Major depression  Hematologic:  []  Bleeding problems? []  Problems with blood clotting?  Dermatologic:  []  Rashes or ulcers?  Constitutional:  []  Fever or chills?  Ear/Nose/Throat:  []  Change in hearing? []  Nose bleeds? []  Sore throat?  Musculoskeletal:  []  Back pain? []  Joint pain? []  Muscle  pain?   Physical Examination     Vitals:   09/24/23 0938  BP: 133/84  Pulse: 87  Temp: 97.7 F (36.5 C)  TempSrc: Temporal   There is no height or weight on file to calculate BMI.  General:  WDWN in NAD; in wheelchair Gait: Not observed HENT: WNL, normocephalic Pulmonary: normal non-labored breathing  Cardiac: Regular Abdomen: soft, NT, no masses Skin:  without rashes Vascular Exam/Pulses: Brisk bilateral DP/PT Doppler signals Extremities: Edematous RLE from the knee to the top of the foot, slight erythema Musculoskeletal: no muscle wasting or atrophy  Neurologic: A&O X 3 Psychiatric:  The pt has Normal affect.    Non-invasive Vascular Imaging   RLE DVT Study (09/24/2023):  No evidence of DVT in the right lower extremity  ABIs (09/24/2023) +---------+------------------+-----+----------+--------+  Right   Rt Pressure (mmHg)IndexWaveform  Comment   +---------+------------------+-----+----------+--------+  PTA     103               0.67 monophasic          +---------+------------------+-----+----------+--------+  DP      80                0.52 monophasic          +---------+------------------+-----+----------+--------+  Great Toe61                0.40 Abnormal            +---------+------------------+-----+----------+--------+   +---------+------------------+-----+---------+-------+  Left    Lt Pressure (mmHg)IndexWaveform Comment  +---------+------------------+-----+---------+-------+  Brachial 154                                      +---------+------------------+-----+---------+-------+  PTA     169               1.10 triphasic         +---------+------------------+-----+---------+-------+  DP      164               1.06 triphasic         +---------+------------------+-----+---------+-------+  Great Toe131               0.85 Normal            +---------+------------------+-----+---------+-------+    +-------+-----------+-----------+------------+------------+  ABI/TBIToday's ABIToday's TBIPrevious ABIPrevious TBI  +-------+-----------+-----------+------------+------------+  Right .67        .40        .65         .54           +-------+-----------+-----------+------------+------------+  Left  1.10       .85        1.07        .88           +-------+-----------+-----------+------------+------------+   Medical Decision Making   Andrea Townsend is a 63 y.o. female who presents as a triage visit  We have previously seen the patient for a history of right lower extremity DVT diagnosed in August 2024.  She completed 6 months of anticoagulation in March and was transitioned to a daily baby aspirin.  Her last DVT study was negative She returns today as a triage visit.  She states that her leg swelling on the right suddenly got worse again about a month ago.  Overall this is a little bit better than what it was last year after her DVT, however it has caused her some discomfort She endorses regularly wearing her compression stockings, which has helped with her swelling somewhat.  She does admit that she has not been elevating her legs much On exam she does have an edematous right leg from the knee to the foot.  There is also some slight erythema.  Her lower extremities are well-perfused with brisk Doppler signals Given the patient does have a history of DVT,  I had her get an add-on study to rule out recurrent DVT.  Thankfully her study was negative for DVT.  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  I have encouraged her to continue to wear her compression stockings.  I have also emphasized the importance of her regularly elevating her legs to help with her swelling. We also follow the patient for PAD with a known asymptomatic right EIA occlusion.  Her ABIs were stable at today's visit.  She denies any rest pain or  tissue loss. She can follow-up with our office in 6 months with repeat ABIs   Shey Yott SHAUNNA Holster, PA-C [facility] Office: [phone]  09/24/2023, 10:18 AM  Clinic MD: Meryl

## 2023-09-25 ENCOUNTER — Other Ambulatory Visit: Payer: Self-pay

## 2023-09-25 DIAGNOSIS — I739 Peripheral vascular disease, unspecified: Secondary | ICD-10-CM

## 2023-09-30 ENCOUNTER — Telehealth: Payer: Self-pay

## 2023-09-30 NOTE — Telephone Encounter (Signed)
 Received on base communication from [employer]  requesting a form to be filled out and faxed back to them at [phone] for new patient assistance prescription . Requesting all current meds and allergies. Form deleted. Patient not AAC past or present error.

## 2023-10-29 DIAGNOSIS — Z1331 Encounter for screening for depression: Secondary | ICD-10-CM | POA: Diagnosis not present

## 2023-10-29 DIAGNOSIS — I10 Essential (primary) hypertension: Secondary | ICD-10-CM | POA: Diagnosis not present

## 2023-10-29 DIAGNOSIS — Z0000 Encounter for general adult medical examination without abnormal findings: Secondary | ICD-10-CM | POA: Diagnosis not present

## 2023-10-29 DIAGNOSIS — I69351 Hemiplegia and hemiparesis following cerebral infarction affecting right dominant side: Secondary | ICD-10-CM | POA: Diagnosis not present

## 2023-10-29 DIAGNOSIS — Z0001 Encounter for general adult medical examination with abnormal findings: Secondary | ICD-10-CM | POA: Diagnosis not present

## 2023-10-29 DIAGNOSIS — Z136 Encounter for screening for cardiovascular disorders: Secondary | ICD-10-CM | POA: Diagnosis not present

## 2023-10-29 DIAGNOSIS — I69320 Aphasia following cerebral infarction: Secondary | ICD-10-CM | POA: Diagnosis not present

## 2023-10-29 DIAGNOSIS — R7303 Prediabetes: Secondary | ICD-10-CM | POA: Diagnosis not present

## 2023-10-29 DIAGNOSIS — Z23 Encounter for immunization: Secondary | ICD-10-CM | POA: Diagnosis not present

## 2023-10-30 ENCOUNTER — Encounter (HOSPITAL_COMMUNITY)

## 2023-10-30 ENCOUNTER — Ambulatory Visit

## 2023-10-30 ENCOUNTER — Other Ambulatory Visit (HOSPITAL_COMMUNITY): Payer: Self-pay | Admitting: Internal Medicine

## 2023-10-30 DIAGNOSIS — R609 Edema, unspecified: Secondary | ICD-10-CM

## 2023-11-11 ENCOUNTER — Other Ambulatory Visit: Payer: Self-pay

## 2023-11-11 DIAGNOSIS — J4540 Moderate persistent asthma, uncomplicated: Secondary | ICD-10-CM

## 2023-11-11 NOTE — Progress Notes (Unsigned)
 This encounter was created in error - please disregard.

## 2023-11-18 ENCOUNTER — Other Ambulatory Visit: Payer: Self-pay

## 2023-11-18 DIAGNOSIS — J4540 Moderate persistent asthma, uncomplicated: Secondary | ICD-10-CM

## 2023-11-18 MED ORDER — BREZTRI AEROSPHERE 160-9-4.8 MCG/ACT IN AERO: 2.0000 | INHALATION_SPRAY | Freq: Two times a day (BID) | RESPIRATORY_TRACT | 3 refills | Status: AC

## 2023-11-25 ENCOUNTER — Telehealth: Payer: Self-pay

## 2023-11-25 NOTE — Telephone Encounter (Signed)
 [place] and me sent fax stating patient not eligible

## 2023-12-05 ENCOUNTER — Ambulatory Visit (HOSPITAL_COMMUNITY)

## 2023-12-11 ENCOUNTER — Ambulatory Visit

## 2023-12-11 ENCOUNTER — Encounter (HOSPITAL_COMMUNITY)

## 2024-01-20 ENCOUNTER — Encounter (HOSPITAL_COMMUNITY): Payer: Self-pay

## 2024-01-20 ENCOUNTER — Ambulatory Visit (HOSPITAL_COMMUNITY): Attending: Cardiovascular Disease

## 2024-01-20 ENCOUNTER — Encounter (HOSPITAL_COMMUNITY): Payer: Self-pay | Admitting: Internal Medicine

## 2024-02-09 IMAGING — DX DG CHEST 2V
2 series · 2 of 2 positions shown · non-contrast
Comparison: Radiograph 04/19/2021

CLINICAL DATA: Productive cough

EXAM:
CHEST - 2 VIEW

[chest lat]
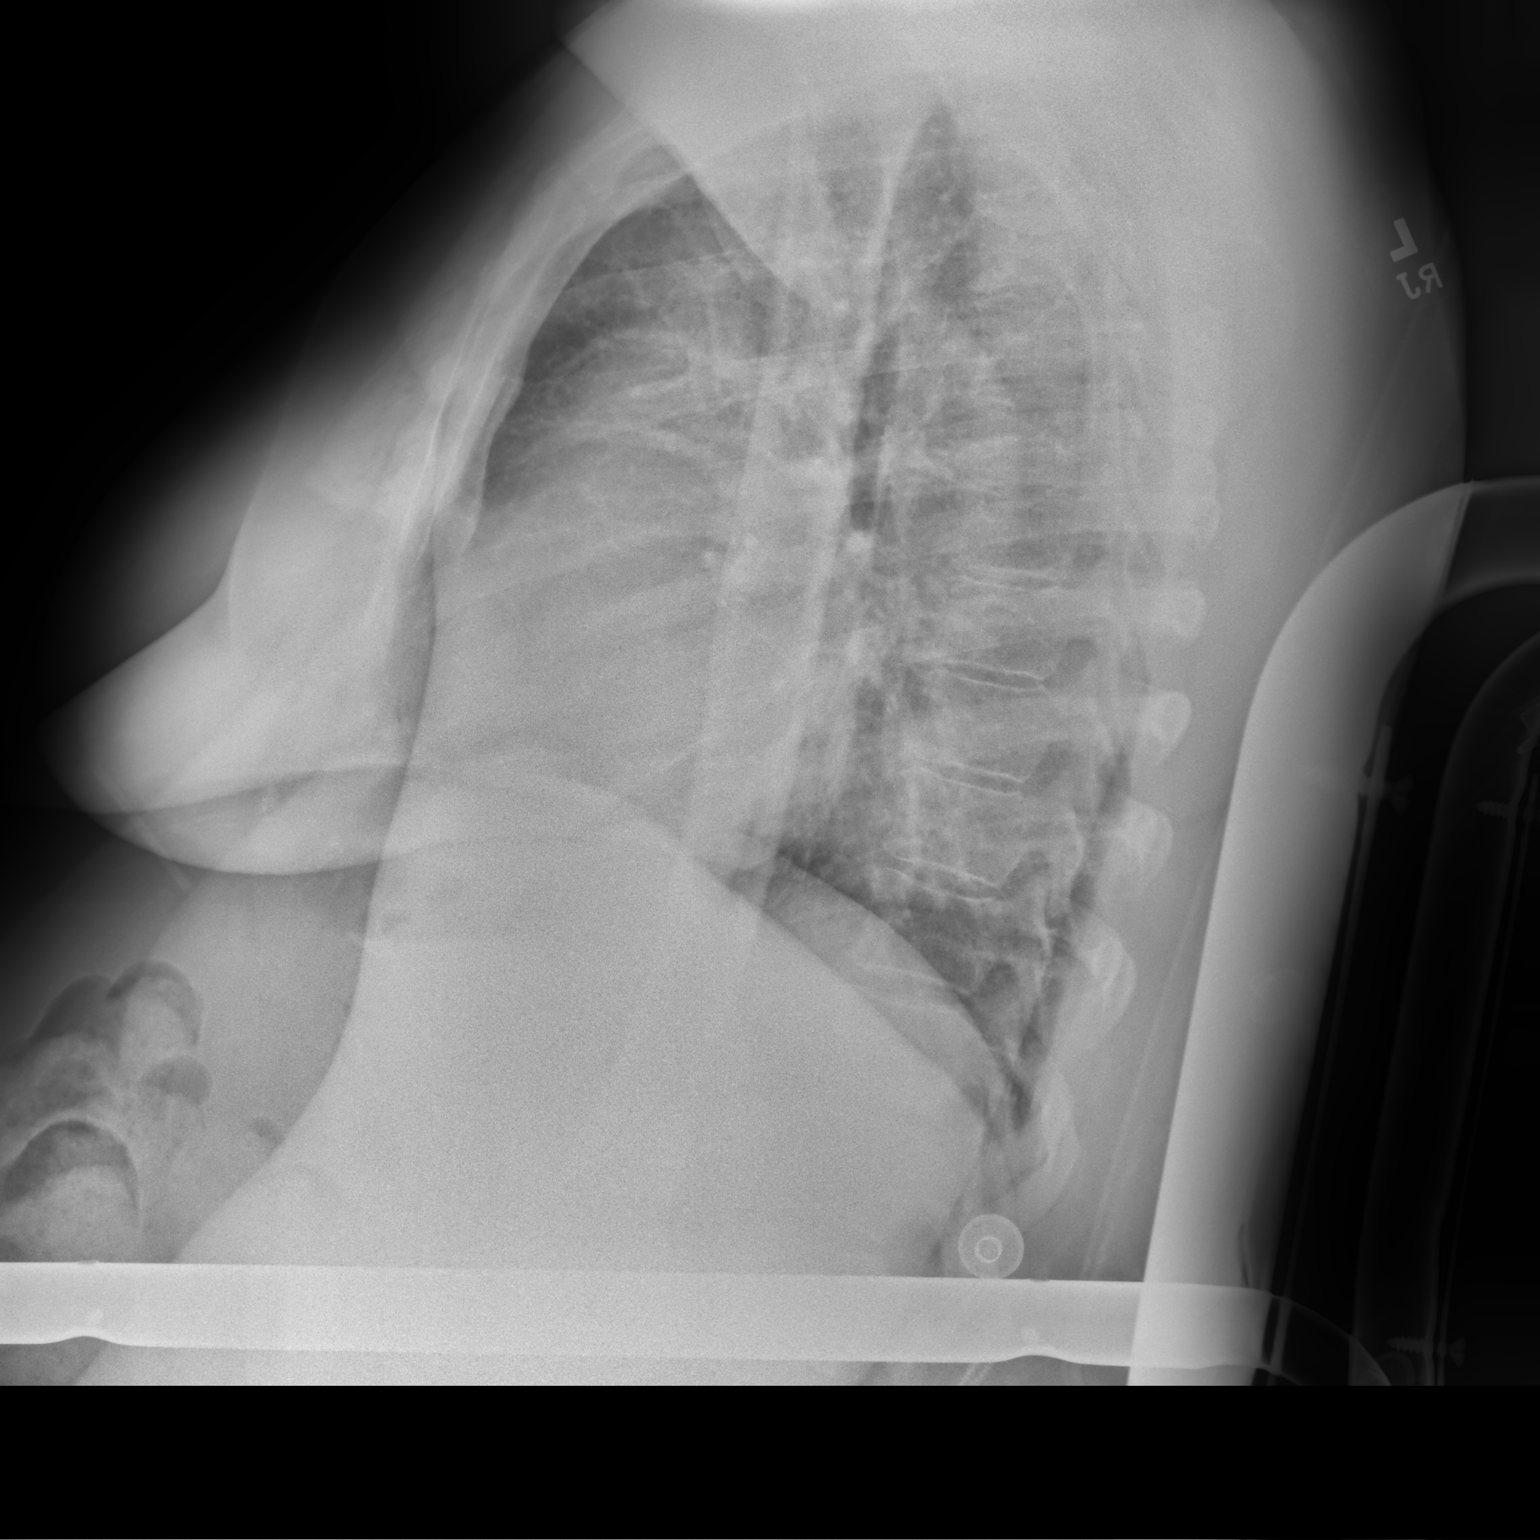

[chest ap]
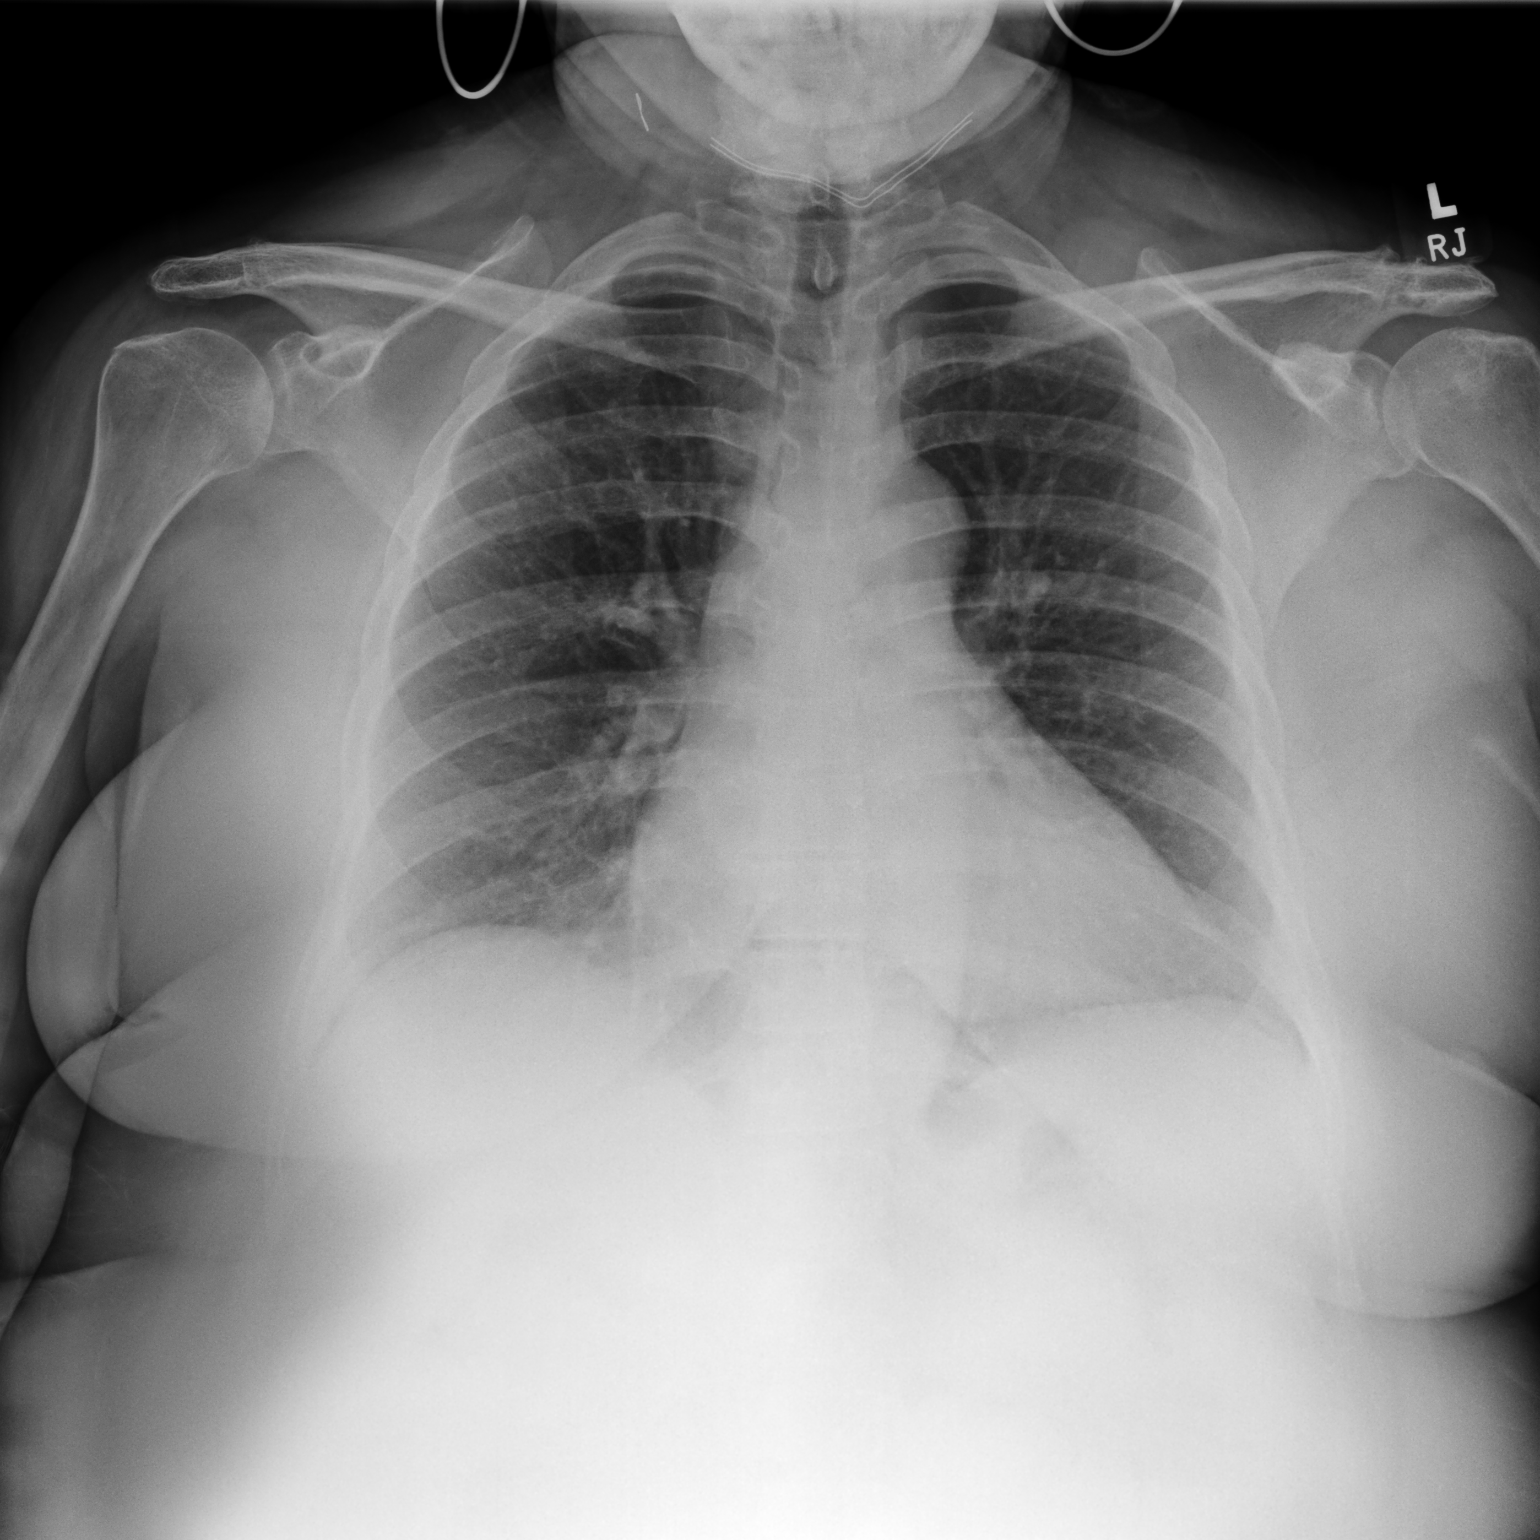

[2 of 2 positions shown; findings below may reference images not displayed]

FINDINGS: Unchanged cardiomediastinal silhouette. There is no focal airspace
disease. There is no pleural effusion. No pneumothorax. There is no
acute osseous abnormality. Bilateral shoulder degenerative changes
left greater than right.
IMPRESSION: No evidence of acute cardiopulmonary disease.

## 2024-04-01 ENCOUNTER — Ambulatory Visit (HOSPITAL_COMMUNITY)

## 2024-04-01 ENCOUNTER — Ambulatory Visit
